# Patient Record
Sex: Female | Born: 1965 | Race: White | Hispanic: No | Marital: Married | Smoking: Former smoker
Health system: Southern US, Community
[De-identification: ages and names within clinical notes are randomized; demographics above are authoritative.]

## PROBLEM LIST (undated history)

## (undated) HISTORY — PX: CHOLECYSTECTOMY: SHX55

---

## 2001-03-05 ENCOUNTER — Observation Stay (HOSPITAL_COMMUNITY): Admission: RE | Admit: 2001-03-05 | Discharge: 2001-03-06 | Payer: Self-pay | Admitting: General Surgery

## 2007-07-17 ENCOUNTER — Encounter: Admission: RE | Admit: 2007-07-17 | Discharge: 2007-07-17 | Payer: Self-pay | Admitting: Obstetrics and Gynecology

## 2007-10-16 ENCOUNTER — Other Ambulatory Visit: Admission: RE | Admit: 2007-10-16 | Discharge: 2007-10-16 | Payer: Self-pay | Admitting: Obstetrics and Gynecology

## 2009-09-07 ENCOUNTER — Other Ambulatory Visit: Admission: RE | Admit: 2009-09-07 | Discharge: 2009-09-07 | Payer: Self-pay | Admitting: Obstetrics and Gynecology

## 2010-10-13 NOTE — Op Note (Signed)
[facility]  Patient:    Hayley Lynch, Hayley Lynch Visit Number: [id number] MRN: [id number]          Service Type: OBV Location: 3 A304 01 Attending Physician:  Barbaraann Barthel Dictated by:   Barbaraann Barthel, M.D. Proc. Date: 03/05/01 Admit Date:  03/05/2001   CC:         Zigmund Gottron, M.D., c/o Barbaraann Barthel, M.D.   Operative Report  SURGEON:  Barbaraann Barthel, M.D.  ASSISTANT:  Christin Bach, M.D.  PREOPERATIVE DIAGNOSIS:  Cholecystitis secondary to polyp, gallbladder polyp versus stone.  POSTOPERATIVE DIAGNOSIS:  PROCEDURE:  Laparoscopic cholecystectomy.  NOTE:  This is a 45 year old white female who had episodes of right upper quadrant postprandial pain, particularly after eating a greasy meal.  She had a sonogram which revealed the presence of a stone or a polyp in her gallbladder.  She was referred for surgery and we discussed the need for laparoscopic cholecystectomy based on these findings.  We discussed complications not limited to but including bleeding, infection, damage to bile duct, perforations of organs and transitory diarrhea.  Informed consent was obtained.  GROSS OPERATIVE FINDINGS:  The patient had a moderate amount of adhesions around the gallbladder, a long floppy gallbladder with a long cystic duct which was normal caliber and was not cannulated for cholangiogram.  The rest of the right upper quadrant appeared to be normal.  She had a rather large lymph node of [place] noted; otherwise, the right upper quadrant appeared to be normal grossly laparoscopically speaking.  TECHNIQUE:  The patient was placed in the supine position and after the adequate administration of general anesthesia via endotracheal intubation, her entire abdomen was prepped with Betadine solution and draped in the usual manner; prior to this, a Foley catheter was aseptically inserted.  A periumbilical incision was carried out over the superior aspect  of the umbilicus, the fascia was grasped with a sharp towel clip and a Veress needle was inserted and confirmed in position with a saline drop test.  Then using the Visiport technique, an 11-mm [place] Surgical cannula was placed in the umbilicus incision and then under direct vision, three other cannulae were placed, an 11-mm cannula in the epigastrium and two 5-mm cannulae in the right upper quadrant laterally.  The gallbladder was then grasped with the graspers, its adhesions taken down and the cystic duct was triply silver-clipped on the side of the common bile duct and singly silver-clipped on the side of the gallbladder and divided.  The cystic artery was likewise identified, triply silver-clipped and divided.  The gallbladder was then removed uneventfully from the hepatic bed using a hook cautery device.  The area was then irrigated and we checked for hemostasis -- this was controlled with the hook cautery -- and the gallbladder was then removed from the epigastric incision.  I then placed a small amount of Surgicel over the liver bed and then desufflated the abdomen after checking for hemostasis and suctioning out the irrigation.  The fascia was then closed in the area of the umbilicus and epigastric incision with 0 Polysorb suture and all skin incisions were then approximated with small surgical clips.  I elected to place 0.5% Sensorcaine around all incisions to help with postoperative discomfort.  Prior to closure, all sponge, needle and instrument counts were found to be correct.  Estimated blood loss was minimal. Patient received 800 cc of crystalloids intraoperatively and there were no complications. Dictated by:   Barbaraann Barthel, M.D. Attending Physician:  Malvin Johns,  Chrissie Noa DD:  03/05/01 TD:  03/06/01 Job: [id number]

## 2019-02-08 DIAGNOSIS — Z20828 Contact with and (suspected) exposure to other viral communicable diseases: Secondary | ICD-10-CM | POA: Diagnosis not present

## 2019-02-09 DIAGNOSIS — R350 Frequency of micturition: Secondary | ICD-10-CM | POA: Diagnosis not present

## 2019-03-16 ENCOUNTER — Telehealth: Payer: Self-pay | Admitting: Obstetrics and Gynecology

## 2019-03-16 NOTE — Telephone Encounter (Signed)

## 2019-03-17 ENCOUNTER — Other Ambulatory Visit (HOSPITAL_COMMUNITY)
Admission: RE | Admit: 2019-03-17 | Discharge: 2019-03-17 | Disposition: A | Discharge status: Home / Self Care | Payer: BC Managed Care – PPO | Source: Ambulatory Visit | Attending: Obstetrics and Gynecology | Admitting: Obstetrics and Gynecology

## 2019-03-17 ENCOUNTER — Encounter: Payer: Self-pay | Admitting: Obstetrics and Gynecology

## 2019-03-17 ENCOUNTER — Other Ambulatory Visit: Payer: Self-pay

## 2019-03-17 ENCOUNTER — Ambulatory Visit (INDEPENDENT_AMBULATORY_CARE_PROVIDER_SITE_OTHER): Payer: BC Managed Care – PPO | Admitting: Obstetrics and Gynecology

## 2019-03-17 VITALS — BP 162/98 | HR 76 | Ht 64.25 in | Wt 247.0 lb

## 2019-03-17 DIAGNOSIS — Z01419 Encounter for gynecological examination (general) (routine) without abnormal findings: Secondary | ICD-10-CM | POA: Diagnosis not present

## 2019-03-17 DIAGNOSIS — Z87440 Personal history of urinary (tract) infections: Secondary | ICD-10-CM | POA: Diagnosis not present

## 2019-03-17 DIAGNOSIS — E669 Obesity, unspecified: Secondary | ICD-10-CM

## 2019-03-17 LAB — POCT URINALYSIS DIPSTICK OB
Blood, UA: NEGATIVE
Glucose, UA: NEGATIVE
Ketones, UA: NEGATIVE
Leukocytes, UA: NEGATIVE
Nitrite, UA: NEGATIVE
POC,PROTEIN,UA: NEGATIVE

## 2019-03-17 NOTE — Patient Instructions (Signed)
.jfweight Discussion: Discussed with pt weight loss methods including food measurement and calorie counting using apps such as MyNetDiary or My Fitness Pal. Recommended the use of measuring cups and spoons to monitor serving sizes. Encouraged adequate daily water intake, especially prior to meals to eliminate overeating. Additionally encouraged pt to become more active by taking daily walks of at least 30 minute duration, join a local gym such as the [facility] or attend water aerobics classes. Also advised pt to use pedometer on smartphone or utilize a smartband such as FitBit to keep track of daily activity.     ITrackBites app for weight loss management  Patient Education   Weight Loss Diet  About this topic  There are many "trendy" weight loss diets that are popular today. Many of these diets can end up being more harmful than helpful. The healthiest way to lose weight is to burn more calories than you eat.  A weight loss diet should help you have a healthy view of eating. It is NOT healthy to stop eating to try and lose weight. A good diet plan will help you cut down your food intake and make healthy choices.  A healthy weight loss goal is 1 to 2 pounds (0.5 to 1 kg) per week. It takes 3500 calories to lose 1 pound (0.5 kg). That means cutting out 500 calories every day for 7 days. You can lose these 500 calories per day by eating fewer calories, burning them through exercise, or both.  It may be easier than you think to cut 500 calories from your daily intake. You can:   Switch from whole milk to 1% or skim milk   Switch from regular cheese to fat-free cheese   Use healthier condiment choices  ? Fat-free sour cream or salad dressings  ? Spray butter  ? Diet syrups or jellies over regular   Try frozen yogurt as a dessert rather than eating ice cream.   Skip the chips. Snack on carrots, vegetables, or fruit. If chips are a favorite of yours, try the baked style.   Eat boiled, seared fish  or skinless chicken rather than red meat.   Try flavored no-calorie waters. Do not drink soda and juices that have many calories.  General   Eating smaller meals more often may be helpful. This will help keep your metabolism high and your hunger low. This will keep you from overeating at your next meal. Also, eating meals slowly helps you feel full faster.  ? If eating 3 meals is a part of your lifestyle, choose more low-fat proteins and higher fiber to fill you up at each meal. Skip the snack at night if you can. Drink a calorie-free beverage instead.   Do not skip meals. Most often if you skip a meal, you eat too much at the next meal.   Eat smaller portions. Use a smaller plate or bowl for meals, and when you are eating out, eat half and take the rest home.   Plan ahead. Plan your meals and grocery list before going to the store. Planning will keep you from getting meals from fast foods or restaurants.   Do not go to the grocery store hungry. You are more likely to buy snacks that are not good for you.   Portion out snacks. When you are having a snack, instead of grabbing the whole bag, portion a small amount out to give yourself a stopping point.   Drink water before and after your meals  to help fill you up without the calories.   When eating starchy foods, choose whole-grain products. These have a lot of fiber which will make you feel full. Fiber also helps lower cholesterol and helps with bowel function.   If you need a helpful start, ask your doctor to send you to a dietitian for weight loss help.    What will the results be?  Losing excess weight will make your whole body healthier. You will have more energy for your daily activities and lower your risk for health problems.  What lifestyle changes are needed?  Stay active. Eating healthy is not always enough to lose weight. Burning calories by exercising is a big part of weight loss.  What foods are good to eat?  The key is to  watch your portion sizes. It is best to choose foods that are lower in fat and calories.   Choose low-fat meats:  ? Boneless chicken breast  ? Pork loin  ? 90% lean beef  ? Lean [place]key meat  ? Fresh fish (not fried)   Choose low-fat dairy products:  ? 1% or skim milk  ? Spray butter or margarine  ? Low-fat or fat-free cheese  ? Frozen yogurt or low calorie ice cream   Choose fresh fruits, green vegetables, beans and lentils, and whole wheat products more often.   Choose smart snacks:  ? Baked chips  ? Pretzels  ? Popcorn with no butter ? use pepper, garlic, or another spice to taste  ? High fiber, low-fat crackers  ? Reduced fat cookies  ? Diet or no-calorie beverages  What foods should be limited or avoided?  Limit high-fat, high-sodium, and high-calorie foods like:   Fried foods   Processed meats   Whole-fat dairy products   Candy, cookies, chips, pastries   Sausage, bacon, any full-fat meats   Soda, juice   Beer, wine, and mixed drinks (alcohol)  Will there be any other care needed?  What do I do first before trying to lose weight?   Talk to your doctor and dietitian to see if you need to lose weight. Work with them to set your weight loss goals.   If you have a chronic illness, such as high blood sugar or high blood pressure, ask a doctor or dietitian what diet and exercise is right for you.   Ask your doctor about how much you are able to exercise and what type of exercise is good for you.  Helpful tips   Keep a food journal to help keep you on track.   Do not eat 2 hours before bedtime.   Join a support group.  Tips for burning calories:   If your workplace is near your house, choose to walk or bike to work instead of driving.   Take 20-minute walks each day. Walk around during your lunch break. You will not only burn calories, but raise your energy for the rest of the day.   Take the stairs over the elevators.   Join a gym or exercise class with a  friend.   Try to exercise 30 minutes a day. Three 10 minute sessions works too.   Drink lots of water before, during, and after exercise.  Where can I learn more?  https://www.bernard.org/ChooseMyPlate.gov  https://powell.info/https://www.choosemyplate.gov/ten-tips-make-better-food-choices  https://www.bernard.org/ChooseMyPlate.gov  https://robinson-schaefer.info/https://www.choosemyplate.gov/ten-tips-get-the-facts-to-look-and-feel-better  Weight-Control Information Network  https://www.hensley.biz/http://win.niddk.nih.gov/publications/for_life.htm  Weight-Control Information Network  MrDecember.dkhttp://www.win.niddk.nih.gov/publications/better_health.htm  Last Reviewed Date  2015-02-22  Consumer Information Use and Disclaimer  This information is not specific medical  advice and does not replace information you receive from your health care provider. This is only a brief summary of general information. It does NOT include all information about conditions, illnesses, injuries, tests, procedures, treatments, therapies, discharge instructions or life-style choices that may apply to you. You must talk with your health care provider for complete information about your health and treatment options. This information should not be used to decide whether or not to accept your health care provider's advice, instructions or recommendations. Only your health care provider has the knowledge and training to provide advice that is right for you.  Copyright  Copyright  2019 [facility] [employer], Inc. and its affiliates and/or licensors. All rights reserved.

## 2019-03-17 NOTE — Progress Notes (Addendum)
Patient ID: Hayley Lynch, female   DOB: 09-11-1965, 53 y.o.   MRN: [id number]  Assessment:  Annual Gyn Exam CBC A1C TSH Lipids CMET All ordered Essential HTN vs anxiety Plan:  1. pap smear done, next pap due 3 years 2. return annually or prn 3    Schedule Mammo 4 pt to get bp cuff , report next 5 values. Subjective:  Hayley Lynch is a 53 y.o. female No obstetric history on file. who presents for annual exam. No LMP recorded. Patient is perimenopausal. The patient has complaints today of had UTI few weeks ago. Clear when checked in office today. BP today in office. Her and her son often go walking together. She quit smoking in [place] and isn't happy with her weight, she has had 20 lb weight gain but believes it is due to her smoking habits. Is no longer having periods and wants to know if she needs to take FE sulfate. She stays at home with her grandchildren. Mother died of ovarian cancer at 57. Last mammogram was in 2009.  The following portions of the patient's history were reviewed and updated as appropriate: allergies, current medications, past family history, past medical history, past social history, past surgical history and problem list. History reviewed. No pertinent past medical history.  Past Surgical History:  Procedure Laterality Date  . CHOLECYSTECTOMY      No current outpatient medications on file.  Review of Systems Constitutional: negative Gastrointestinal: negative Genitourinary: normal  Objective:  BP (!) 162/98 (BP Location: Right Arm, Patient Position: Sitting, Cuff Size: Normal)   Pulse 76   Ht 5' 4.25" (1.632 m)   Wt 247 lb (112 kg)   BMI 42.07 kg/m    BMI: Body mass index is 42.07 kg/m.  General Appearance: Alert, appropriate appearance for age. No acute distress HEENT: Grossly normal Neck / Thyroid:  Cardiovascular: RRR; normal S1, S2, no murmur Lungs: CTA bilaterally Back: No CVAT Breast Exam: Normal breast tissue bilaterally and No  masses or nodes.No dimpling, nipple retraction or discharge. Gastrointestinal: Soft, non-tender, no masses or organomegaly Pelvic Exam: VAGINA: normal appearing secretions CERVIX: normal appearing cervix,  UTERUS: uterus is normal size, shape, consistency and nontender,  RECTAL: guaiac negative stool obtained,  PAP: Pap smear done today. Lymphatic Exam: Non-palpable nodes in neck, clavicular, axillary, or inguinal regions  Skin: no rash or abnormalities Neurologic: Normal gait and speech, no tremor  Psychiatric: Alert and oriented, appropriate affect.  Urinalysis:Not done At end of discussion, pt had opportunity to ask questions and has no further questions at this time.   Specific discussion of lifestyle changes, behavioral modifications and healthy eating habits as noted above. Greater than 50% was spent in counseling and coordination of care with the patient.  Total time greater than: 25 minutes.  By signing my name below, I, Samul Dada, attest that this documentation has been prepared under the direction and in the presence of Jonnie Kind, MD. Electronically Signed: [place]. 03/17/19. 10:54 AM.  I personally performed the services described in this documentation, which was SCRIBED in my presence. The recorded information has been reviewed and considered accurate. It has been edited as necessary during review. Jonnie Kind, MD

## 2019-03-18 ENCOUNTER — Other Ambulatory Visit: Payer: Self-pay | Admitting: Obstetrics and Gynecology

## 2019-03-18 LAB — CBC
Hematocrit: 44.4 % (ref 34.0–46.6)
Hemoglobin: 14.4 g/dL (ref 11.1–15.9)
MCH: 31.5 pg (ref 26.6–33.0)
MCHC: 32.4 g/dL (ref 31.5–35.7)
MCV: 97 fL (ref 79–97)
Platelets: 243 10*3/uL (ref 150–450)
RBC: 4.57 x10E6/uL (ref 3.77–5.28)
RDW: 14.1 % (ref 11.7–15.4)
WBC: 6.5 10*3/uL (ref 3.4–10.8)

## 2019-03-18 LAB — COMPREHENSIVE METABOLIC PANEL
ALT: 108 IU/L — ABNORMAL HIGH (ref 0–32)
AST: 73 IU/L — ABNORMAL HIGH (ref 0–40)
Albumin/Globulin Ratio: 1.4 (ref 1.2–2.2)
Albumin: 4 g/dL (ref 3.8–4.9)
Alkaline Phosphatase: 141 IU/L — ABNORMAL HIGH (ref 39–117)
BUN/Creatinine Ratio: 17 (ref 9–23)
BUN: 15 mg/dL (ref 6–24)
Bilirubin Total: 0.3 mg/dL (ref 0.0–1.2)
CO2: 21 mmol/L (ref 20–29)
Calcium: 9.4 mg/dL (ref 8.7–10.2)
Chloride: 105 mmol/L (ref 96–106)
Creatinine, Ser: 0.9 mg/dL (ref 0.57–1.00)
GFR calc Af Amer: 83 mL/min/{1.73_m2} (ref >59)
GFR calc non Af Amer: 72 mL/min/{1.73_m2} (ref >59)
Globulin, Total: 2.9 g/dL (ref 1.5–4.5)
Glucose: 110 mg/dL — ABNORMAL HIGH (ref 65–99)
Potassium: 4.3 mmol/L (ref 3.5–5.2)
Sodium: 142 mmol/L (ref 134–144)
Total Protein: 6.9 g/dL (ref 6.0–8.5)

## 2019-03-18 LAB — LIPID PANEL WITH LDL/HDL RATIO
Cholesterol, Total: 203 mg/dL — ABNORMAL HIGH (ref 100–199)
HDL: 48 mg/dL (ref >39)
LDL Chol Calc (NIH): 134 mg/dL — ABNORMAL HIGH (ref 0–99)
LDL/HDL Ratio: 2.8 ratio (ref 0.0–3.2)
Triglycerides: 116 mg/dL (ref 0–149)
VLDL Cholesterol Cal: 21 mg/dL (ref 5–40)

## 2019-03-18 LAB — TSH: TSH: 4.96 u[IU]/mL — ABNORMAL HIGH (ref 0.450–4.500)

## 2019-03-18 LAB — HEMOGLOBIN A1C
Est. average glucose Bld gHb Est-mCnc: 117 mg/dL
Hgb A1c MFr Bld: 5.7 % — ABNORMAL HIGH (ref 4.8–5.6)

## 2019-03-18 MED ORDER — LEVOTHYROXINE SODIUM 50 MCG PO TABS: 50.0000 ug | ORAL_TABLET | Freq: Every day | ORAL | 6 refills | Status: DC

## 2019-03-18 NOTE — Progress Notes (Signed)
Synthroid Rx sent to pharmacy

## 2019-03-19 LAB — CYTOLOGY - PAP
Comment: NEGATIVE
Diagnosis: NEGATIVE
High risk HPV: NEGATIVE

## 2019-04-06 ENCOUNTER — Ambulatory Visit (HOSPITAL_COMMUNITY)
Admission: RE | Admit: 2019-04-06 | Discharge: 2019-04-06 | Disposition: A | Discharge status: Home / Self Care | Payer: BC Managed Care – PPO | Source: Ambulatory Visit | Attending: Obstetrics and Gynecology | Admitting: Obstetrics and Gynecology

## 2019-04-06 ENCOUNTER — Other Ambulatory Visit: Payer: Self-pay

## 2019-04-06 ENCOUNTER — Other Ambulatory Visit (HOSPITAL_COMMUNITY): Payer: Self-pay | Admitting: Obstetrics and Gynecology

## 2019-04-06 DIAGNOSIS — Z1231 Encounter for screening mammogram for malignant neoplasm of breast: Secondary | ICD-10-CM

## 2019-09-11 ENCOUNTER — Other Ambulatory Visit: Payer: Self-pay | Admitting: Obstetrics and Gynecology

## 2019-12-01 ENCOUNTER — Ambulatory Visit
Admission: EM | Admit: 2019-12-01 | Discharge: 2019-12-01 | Disposition: A | Discharge status: Home / Self Care | Payer: BC Managed Care – PPO | Attending: Emergency Medicine | Admitting: Emergency Medicine

## 2019-12-01 ENCOUNTER — Encounter: Payer: Self-pay | Admitting: Emergency Medicine

## 2019-12-01 DIAGNOSIS — M545 Low back pain, unspecified: Secondary | ICD-10-CM

## 2019-12-01 DIAGNOSIS — R350 Frequency of micturition: Secondary | ICD-10-CM

## 2019-12-01 LAB — POCT URINALYSIS DIP (MANUAL ENTRY)
Blood, UA: NEGATIVE
Glucose, UA: NEGATIVE mg/dL
Ketones, POC UA: NEGATIVE mg/dL
Leukocytes, UA: NEGATIVE
Nitrite, UA: NEGATIVE
Protein Ur, POC: 30 mg/dL — AB
Spec Grav, UA: >=1.03 — AB (ref 1.010–1.025)
Urobilinogen, UA: 1 E.U./dL
pH, UA: 5.5 (ref 5.0–8.0)

## 2019-12-01 MED ORDER — PREDNISONE 20 MG PO TABS: 20.0000 mg | ORAL_TABLET | Freq: Two times a day (BID) | ORAL | 0 refills | Status: AC

## 2019-12-01 NOTE — Discharge Instructions (Signed)
Urine without signs of infection Continue conservative management of rest, ice, and gentle stretches/ massage Prednisone prescribed.  Take as directed and to completion Follow up with PCP if symptoms persist Return or go to the ER if you have any new or worsening symptoms (fever, chills, chest pain, abdominal pain, changes in bowel or bladder habits, pain radiating into lower legs, etc...)

## 2019-12-01 NOTE — ED Triage Notes (Signed)
Pt has been having lower back pain for a few week, but noticed urgency and her urine getting darker.

## 2019-12-01 NOTE — ED Provider Notes (Signed)
[facility]   [id number] 12/01/19 Arrival Time: 1224  CC: Back pain  SUBJECTIVE: History from: patient. Hayley Lynch is a 54 y.o. female complains of low back pain x few days.  Reports recent trip to the beach and loading/ unloading the vehicle.  Localizes the pain to the LT low back.  Describes the pain as intermittent and "squeezing" in character.  Has tried OTC medications without relief.  Denies aggravating factors.  Reports similar symptoms in the past with kidney infection.  Complains of associated urinary frequency.  Denies fever, chills, erythema, ecchymosis, effusion, weakness, numbness and tingling, saddle paresthesias, loss of bowel or bladder function.      ROS: As per HPI.  All other pertinent ROS negative.     History reviewed. No pertinent past medical history. Past Surgical History:  Procedure Laterality Date   CHOLECYSTECTOMY     Allergies  Allergen Reactions   Codeine    Penicillins    No current facility-administered medications on file prior to encounter.   Current Outpatient Medications on File Prior to Encounter  Medication Sig Dispense Refill   levothyroxine (SYNTHROID) 50 MCG tablet TAKE 1 TABLET BY MOUTH DAILY BEFORE BREAKFAST 90 tablet 2   Social History   Socioeconomic History   Marital status: Married    Spouse name: Not on file   Number of children: Not on file   Years of education: Not on file   Highest education level: Not on file  Occupational History   Not on file  Tobacco Use   Smoking status: Former Smoker    Quit date: 06/28/2018    Years since quitting: 1.4   Smokeless tobacco: Never Used  Vaping Use   Vaping Use: Never used  Substance and Sexual Activity   Alcohol use: Not Currently   Drug use: Not Currently   Sexual activity: Not Currently    Birth control/protection: None  Other Topics Concern   Not on file  Social History Narrative   Not on file   Social Determinants of Health   Financial  Resource Strain:    Difficulty of Paying Living Expenses:   Food Insecurity:    Worried About Programme researcher, broadcasting/film/video in the Last Year:    Barista in the Last Year:   Transportation Needs:    Freight forwarder (Medical):    Lack of Transportation (Non-Medical):   Physical Activity:    Days of Exercise per Week:    Minutes of Exercise per Session:   Stress:    Feeling of Stress :   Social Connections:    Frequency of Communication with Friends and Family:    Frequency of Social Gatherings with Friends and Family:    Attends Religious Services:    Active Member of Clubs or Organizations:    Attends Engineer, structural:    Marital Status:   Intimate Partner Violence:    Fear of Current or Ex-Partner:    Emotionally Abused:    Physically Abused:    Sexually Abused:    Family History  Problem Relation Age of Onset   Cancer Mother        ovarian    OBJECTIVE:  Vitals:   12/01/19 1249 12/01/19 1250  BP: (!) 163/74   Pulse: 83   Resp: 16   Temp: 98.6 F (37 C)   TempSrc: Oral   SpO2: 97%   Weight:  240 lb (108.9 kg)    General appearance: ALERT; in no  acute distress.  Head: NCAT Lungs: Normal respiratory effort; CTAB CV: RRR Musculoskeletal: Back Inspection: Skin warm, dry, clear and intact without obvious erythema, effusion, or ecchymosis.  Palpation: TTP over LT low back ROM: FROM active and passive Strength: 5/5 shld abduction, 5/5 shld adduction, 5/5 elbow flexion, 5/5 elbow extension, 5/5 grip strength, 5/5 hip flexion, 5/5 hip extension Abdomen: soft, nondistended, normal active bowel sounds; nontender to palpation; no guarding  Skin: warm and dry Neurologic: Ambulates without difficulty; Sensation intact about the upper/ lower extremities Psychological: alert and cooperative; normal mood and affect  LABS:  Results for orders placed or performed during the hospital encounter of 12/01/19 (from the past 24 hour(s))  POCT  urinalysis dipstick     Status: Abnormal   Collection Time: 12/01/19 12:46 PM  Result Value Ref Range   Color, UA straw (A) yellow   Clarity, UA clear clear   Glucose, UA negative negative mg/dL   Bilirubin, UA small (A) negative   Ketones, POC UA negative negative mg/dL   Spec Grav, UA >=4.259 (A) 1.010 - 1.025   Blood, UA negative negative   pH, UA 5.5 5.0 - 8.0   Protein Ur, POC =30 (A) negative mg/dL   Urobilinogen, UA 1.0 0.2 or 1.0 E.U./dL   Nitrite, UA Negative Negative   Leukocytes, UA Negative Negative     ASSESSMENT & PLAN:  1. Acute left-sided low back pain without sciatica   2. Urinary frequency     Meds ordered this encounter  Medications   predniSONE (DELTASONE) 20 MG tablet    Sig: Take 1 tablet (20 mg total) by mouth 2 (two) times daily with a meal for 5 days.    Dispense:  10 tablet    Refill:  0    Order Specific Question:   Supervising Provider    Answer:   Eustace Moore [[id number]]   Urine without signs of infection Continue conservative management of rest, ice, and gentle stretches/ massage Prednisone prescribed.  Take as directed and to completion Follow up with PCP if symptoms persist Return or go to the ER if you have any new or worsening symptoms (fever, chills, chest pain, abdominal pain, changes in bowel or bladder habits, pain radiating into lower legs, etc...)   Reviewed expectations re: course of current medical issues. Questions answered. Outlined signs and symptoms indicating need for more acute intervention. Patient verbalized understanding. After Visit Summary given.    Rennis Harding, PA-C 12/01/19 1346

## 2019-12-14 ENCOUNTER — Encounter: Payer: Self-pay | Admitting: Emergency Medicine

## 2019-12-14 ENCOUNTER — Ambulatory Visit (INDEPENDENT_AMBULATORY_CARE_PROVIDER_SITE_OTHER): Payer: Self-pay

## 2019-12-14 ENCOUNTER — Other Ambulatory Visit: Payer: Self-pay

## 2019-12-14 ENCOUNTER — Ambulatory Visit: Payer: Self-pay

## 2019-12-14 ENCOUNTER — Ambulatory Visit
Admission: EM | Admit: 2019-12-14 | Discharge: 2019-12-14 | Disposition: A | Discharge status: Home / Self Care | Payer: Self-pay | Attending: Emergency Medicine | Admitting: Emergency Medicine

## 2019-12-14 DIAGNOSIS — M25572 Pain in left ankle and joints of left foot: Secondary | ICD-10-CM

## 2019-12-14 DIAGNOSIS — S99912A Unspecified injury of left ankle, initial encounter: Secondary | ICD-10-CM

## 2019-12-14 DIAGNOSIS — S82832A Other fracture of upper and lower end of left fibula, initial encounter for closed fracture: Secondary | ICD-10-CM

## 2019-12-14 MED ORDER — TRAMADOL HCL 50 MG PO TABS: 50.0000 mg | ORAL_TABLET | Freq: Four times a day (QID) | ORAL | 0 refills | Status: DC | PRN

## 2019-12-14 NOTE — ED Provider Notes (Addendum)
[facility]   [id number] 12/14/19 Arrival Time: 1856  CC: LT ankle PAIN  SUBJECTIVE: History from: patient. Hayley Lynch is a 54 y.o. female complains of left ankle pain x 30 minutes.  Stepped in hole and twisted ankle.  Pain diffuse about the ankle.  Describes the pain as intermittent and "shock" in character.  Denies alleviating factors.  Symptoms are made worse with weight-bearing.  Denies similar symptoms in the past.  Complains of weakness, swelling and bruising.  Denies fever, chills, erythema, numbness and tingling.  ROS: As per HPI.  All other pertinent ROS negative.     History reviewed. No pertinent past medical history. Past Surgical History:  Procedure Laterality Date  . CHOLECYSTECTOMY     Allergies  Allergen Reactions  . Codeine   . Penicillins    No current facility-administered medications on file prior to encounter.   Current Outpatient Medications on File Prior to Encounter  Medication Sig Dispense Refill  . levothyroxine (SYNTHROID) 50 MCG tablet TAKE 1 TABLET BY MOUTH DAILY BEFORE BREAKFAST 90 tablet 2   Social History   Socioeconomic History  . Marital status: Married    Spouse name: Not on file  . Number of children: Not on file  . Years of education: Not on file  . Highest education level: Not on file  Occupational History  . Not on file  Tobacco Use  . Smoking status: Former Smoker    Quit date: 06/28/2018    Years since quitting: 1.4  . Smokeless tobacco: Never Used  Vaping Use  . Vaping Use: Never used  Substance and Sexual Activity  . Alcohol use: Not Currently  . Drug use: Not Currently  . Sexual activity: Not Currently    Birth control/protection: None  Other Topics Concern  . Not on file  Social History Narrative  . Not on file   Social Determinants of Health   Financial Resource Strain:   . Difficulty of Paying Living Expenses:   Food Insecurity:   . Worried About Programme researcher, broadcasting/film/video in the Last Year:   . Garment/textile technologist in the Last Year:   Transportation Needs:   . Freight forwarder (Medical):   Marland Kitchen Lack of Transportation (Non-Medical):   Physical Activity:   . Days of Exercise per Week:   . Minutes of Exercise per Session:   Stress:   . Feeling of Stress :   Social Connections:   . Frequency of Communication with Friends and Family:   . Frequency of Social Gatherings with Friends and Family:   . Attends Religious Services:   . Active Member of Clubs or Organizations:   . Attends Banker Meetings:   Marland Kitchen Marital Status:   Intimate Partner Violence:   . Fear of Current or Ex-Partner:   . Emotionally Abused:   Marland Kitchen Physically Abused:   . Sexually Abused:    Family History  Problem Relation Age of Onset  . Cancer Mother        ovarian    OBJECTIVE:  Vitals:   12/14/19 1907  BP: 139/81  Pulse: 79  Resp: 16  Temp: 98.6 F (37 C)  TempSrc: Oral  SpO2: 98%    General appearance: ALERT; in no acute distress.  Head: NCAT Lungs: Normal respiratory effort CV: Dorsalis pedis pulse 2+; cap refill < 2 seconds Musculoskeletal: LT ankle Inspection: Swelling Palpation: diffusely TTP over lateral ankle ROM: LROM about the ankle Strength: deferred Skin: warm and  dry Neurologic: Ambulates with antalgic gait; Sensation intact about the lower extremities Psychological: alert and cooperative; normal mood and affect  DIAGNOSTIC STUDIES:  DG Ankle Complete Left  Result Date: 12/14/2019 CLINICAL DATA:  54 year old female with left ankle pain. EXAM: LEFT ANKLE COMPLETE - 3+ VIEW COMPARISON:  None. FINDINGS: Minimally displaced fracture of the distal fibula. No other acute fracture. There is no dislocation. The bones are osteopenic. There is soft tissue swelling over the lateral malleolus. IMPRESSION: Minimally displaced fracture of the distal fibula. Electronically Signed   By: Elgie Collard M.D.   On: 12/14/2019 20:08     X-rays positive for distal fibula fracture.    I have  reviewed the x-rays myself and the radiologist interpretation. I am in agreement with the radiologist interpretation.     ASSESSMENT & PLAN:  1. Closed fracture of distal end of left fibula, unspecified fracture morphology, initial encounter   2. Injury of left ankle, initial encounter   3. Pain of joint of left ankle and foot      Meds ordered this encounter  Medications  . traMADol (ULTRAM) 50 MG tablet    Sig: Take 1 tablet (50 mg total) by mouth every 6 (six) hours as needed.    Dispense:  15 tablet    Refill:  0    Order Specific Question:   Supervising Provider    Answer:   Eustace Moore [[id number]]    Continue conservative management of rest, ice, and elevation Splint applied Crutches given Continue to alternate ibuprofen and tylenol Tramadol prescribed.  Use as needed for severe break-through pain.  DO NOT DRIVE OR OPERATE HEAVY MACHINERY AS THIS MEDICATION MAY MAKE YOU DROWSY Follow up with orthopedist for further evaluation and management Return or go to the ER if you have any new or worsening symptoms (fever, chills, chest pain, redness, swelling, bruising,deformity, worsening symptoms, etc...)   Reviewed expectations re: course of current medical issues. Questions answered. Outlined signs and symptoms indicating need for more acute intervention. Patient verbalized understanding. After Visit Summary given.    Rennis Harding, PA-C 12/14/19 1956    Alvino Chapel [place], PA-C 12/15/19 [phone]

## 2019-12-14 NOTE — ED Triage Notes (Signed)
Feel in hole and is having left ankle pain today.

## 2019-12-14 NOTE — Discharge Instructions (Signed)
Continue conservative management of rest, ice, and elevation Splint applied Crutches given Continue to alternate ibuprofen and tylenol Tramadol prescribed.  Use as needed for severe break-through pain.  DO NOT DRIVE OR OPERATE HEAVY MACHINERY AS THIS MEDICATION MAY MAKE YOU DROWSY Follow up with orthopedist for further evaluation and management Return or go to the ER if you have any new or worsening symptoms (fever, chills, chest pain, redness, swelling, bruising,deformity, worsening symptoms, etc...)

## 2019-12-17 ENCOUNTER — Encounter: Payer: Self-pay | Admitting: Orthopaedic Surgery

## 2019-12-17 ENCOUNTER — Other Ambulatory Visit: Payer: Self-pay

## 2019-12-17 ENCOUNTER — Ambulatory Visit (INDEPENDENT_AMBULATORY_CARE_PROVIDER_SITE_OTHER): Payer: Self-pay | Admitting: Orthopaedic Surgery

## 2019-12-17 VITALS — BP 148/81 | HR 77 | Ht 64.25 in | Wt 240.0 lb

## 2019-12-17 DIAGNOSIS — S8265XA Nondisplaced fracture of lateral malleolus of left fibula, initial encounter for closed fracture: Secondary | ICD-10-CM

## 2019-12-17 DIAGNOSIS — W1842XS Slipping, tripping and stumbling without falling due to stepping into hole or opening, sequela: Secondary | ICD-10-CM

## 2019-12-17 DIAGNOSIS — F17210 Nicotine dependence, cigarettes, uncomplicated: Secondary | ICD-10-CM

## 2019-12-17 NOTE — Progress Notes (Signed)
Subjective:    Patient ID: Hayley Lynch, female    DOB: 14-Jun-1965, 54 y.o.   MRN: [id number]  HPI She stepped in a hole and twisted her left ankle on 12-14-2019.  She had pain. She went to the ER.  X-rays there showed: IMPRESSION: Minimally displaced fracture of the distal fibula.  I have reviewed the ER notes.  I have independently reviewed and interpreted x-rays of this patient done at another site by another physician or qualified health professional.  She was placed in posterior splint.  She has no other injury.  Her pain is controlled.   Review of Systems  Constitutional: Positive for activity change.  Musculoskeletal: Positive for arthralgias, gait problem and joint swelling.  All other systems reviewed and are negative.  For Review of Systems, all other systems reviewed and are negative.  The following is a summary of the past history medically, past history surgically, known current medicines, social history and family history.  This information is gathered electronically by the computer from prior information and documentation.  I review this each visit and have found including this information at this point in the chart is beneficial and informative.   History reviewed. No pertinent past medical history.  Past Surgical History:  Procedure Laterality Date  . CHOLECYSTECTOMY      Current Outpatient Medications on File Prior to Visit  Medication Sig Dispense Refill  . levothyroxine (SYNTHROID) 50 MCG tablet TAKE 1 TABLET BY MOUTH DAILY BEFORE BREAKFAST (Patient not taking: Reported on 12/17/2019) 90 tablet 2  . traMADol (ULTRAM) 50 MG tablet Take 1 tablet (50 mg total) by mouth every 6 (six) hours as needed. (Patient not taking: Reported on 12/17/2019) 15 tablet 0   No current facility-administered medications on file prior to visit.    Social History   Socioeconomic History  . Marital status: Married    Spouse name: Not on file  . Number of children: Not on file   . Years of education: Not on file  . Highest education level: Not on file  Occupational History  . Not on file  Tobacco Use  . Smoking status: Current Every Day Smoker    Types: Cigarettes, E-cigarettes    Last attempt to quit: 06/28/2018    Years since quitting: 1.4  . Smokeless tobacco: Never Used  Vaping Use  . Vaping Use: Never used  Substance and Sexual Activity  . Alcohol use: Not Currently  . Drug use: Not Currently  . Sexual activity: Not Currently    Birth control/protection: None  Other Topics Concern  . Not on file  Social History Narrative  . Not on file   Social Determinants of Health   Financial Resource Strain:   . Difficulty of Paying Living Expenses:   Food Insecurity:   . Worried About Programme researcher, broadcasting/film/video in the Last Year:   . Barista in the Last Year:   Transportation Needs:   . Freight forwarder (Medical):   Marland Kitchen Lack of Transportation (Non-Medical):   Physical Activity:   . Days of Exercise per Week:   . Minutes of Exercise per Session:   Stress:   . Feeling of Stress :   Social Connections:   . Frequency of Communication with Friends and Family:   . Frequency of Social Gatherings with Friends and Family:   . Attends Religious Services:   . Active Member of Clubs or Organizations:   . Attends Banker Meetings:   .  Marital Status:   Intimate Partner Violence:   . Fear of Current or Ex-Partner:   . Emotionally Abused:   Marland Kitchen Physically Abused:   . Sexually Abused:     Family History  Problem Relation Age of Onset  . Cancer Mother        ovarian    BP (!) 148/81   Pulse 77   Ht 5' 4.25" (1.632 m)   Wt 240 lb (108.9 kg)   BMI 40.88 kg/m   Body mass index is 40.88 kg/m.      Objective:   Physical Exam Vitals and nursing note reviewed.  Constitutional:      Appearance: She is well-developed.  HENT:     Head: Normocephalic and atraumatic.  Eyes:     Conjunctiva/sclera: Conjunctivae normal.     Pupils:  Pupils are equal, round, and reactive to light.  Cardiovascular:     Rate and Rhythm: Normal rate and regular rhythm.  Pulmonary:     Effort: Pulmonary effort is normal.  Abdominal:     Palpations: Abdomen is soft.  Musculoskeletal:     Cervical back: Normal range of motion and neck supple.       Feet:  Skin:    General: Skin is warm and dry.  Neurological:     Mental Status: She is alert and oriented to person, place, and time.     Cranial Nerves: No cranial nerve deficit.     Motor: No abnormal muscle tone.     Coordination: Coordination normal.     Deep Tendon Reflexes: Reflexes are normal and symmetric. Reflexes normal.  Psychiatric:        Behavior: Behavior normal.        Thought Content: Thought content normal.        Judgment: Judgment normal.           Assessment & Plan:   Encounter Diagnoses  Name Primary?  . Nondisplaced fracture of lateral malleolus of left fibula, initial encounter for closed fracture Yes  . Nicotine dependence, cigarettes, uncomplicated    I have changed her to a CAM walker and given instructions.  Instructions given for Contrast Baths.  Elevate, ice, stay off foot.  Return in two weeks.  X-rays on return.  Call if any problem.  Precautions discussed.   Electronically Signed Darreld Mclean, MD 7/22/202112:12 PM

## 2019-12-17 NOTE — Patient Instructions (Signed)
Steps to Quit Smoking Smoking tobacco is the leading cause of preventable death. It can affect almost every organ in the body. Smoking puts you and people around you at risk for many serious, long-lasting (chronic) diseases. Quitting smoking can be hard, but it is one of the best things that you can do for your health. It is never too late to quit. How do I get ready to quit? When you decide to quit smoking, make a plan to help you succeed. Before you quit:  Pick a date to quit. Set a date within the next 2 weeks to give you time to prepare.  Write down the reasons why you are quitting. Keep this list in places where you will see it often.  Tell your family, friends, and co-workers that you are quitting. Their support is important.  Talk with your doctor about the choices that may help you quit.  Find out if your health insurance will pay for these treatments.  Know the people, places, things, and activities that make you want to smoke (triggers). Avoid them. What first steps can I take to quit smoking?  Throw away all cigarettes at home, at work, and in your car.  Throw away the things that you use when you smoke, such as ashtrays and lighters.  Clean your car. Make sure to empty the ashtray.  Clean your home, including curtains and carpets. What can I do to help me quit smoking? Talk with your doctor about taking medicines and seeing a counselor at the same time. You are more likely to succeed when you do both.  If you are pregnant or breastfeeding, talk with your doctor about counseling or other ways to quit smoking. Do not take medicine to help you quit smoking unless your doctor tells you to do so. To quit smoking: Quit right away  Quit smoking totally, instead of slowly cutting back on how much you smoke over a period of time.  Go to counseling. You are more likely to quit if you go to counseling sessions regularly. Take medicine You may take medicines to help you quit. Some  medicines need a prescription, and some you can buy over-the-counter. Some medicines may contain a drug called nicotine to replace the nicotine in cigarettes. Medicines may:  Help you to stop having the desire to smoke (cravings).  Help to stop the problems that come when you stop smoking (withdrawal symptoms). Your doctor may ask you to use:  Nicotine patches, gum, or lozenges.  Nicotine inhalers or sprays.  Non-nicotine medicine that is taken by mouth. Find resources Find resources and other ways to help you quit smoking and remain smoke-free after you quit. These resources are most helpful when you use them often. They include:  Online chats with a counselor.  Phone quitlines.  Printed self-help materials.  Support groups or group counseling.  Text messaging programs.  Mobile phone apps. Use apps on your mobile phone or tablet that can help you stick to your quit plan. There are many free apps for mobile phones and tablets as well as websites. Examples include Quit Guide from the CDC and smokefree.gov  What things can I do to make it easier to quit?   Talk to your family and friends. Ask them to support and encourage you.  Call a phone quitline (1-800-QUIT-NOW), reach out to support groups, or work with a counselor.  Ask people who smoke to not smoke around you.  Avoid places that make you want to smoke,   such as: ? Bars. ? Parties. ? Smoke-break areas at work.  Spend time with people who do not smoke.  Lower the stress in your life. Stress can make you want to smoke. Try these things to help your stress: ? Getting regular exercise. ? Doing deep-breathing exercises. ? Doing yoga. ? Meditating. ? Doing a body scan. To do this, close your eyes, focus on one area of your body at a time from head to toe. Notice which parts of your body are tense. Try to relax the muscles in those areas. How will I feel when I quit smoking? Day 1 to 3 weeks Within the first 24 hours,  you may start to have some problems that come from quitting tobacco. These problems are very bad 2-3 days after you quit, but they do not often last for more than 2-3 weeks. You may get these symptoms:  Mood swings.  Feeling restless, nervous, angry, or annoyed.  Trouble concentrating.  Dizziness.  Strong desire for high-sugar foods and nicotine.  Weight gain.  Trouble pooping (constipation).  Feeling like you may vomit (nausea).  Coughing or a sore throat.  Changes in how the medicines that you take for other issues work in your body.  Depression.  Trouble sleeping (insomnia). Week 3 and afterward After the first 2-3 weeks of quitting, you may start to notice more positive results, such as:  Better sense of smell and taste.  Less coughing and sore throat.  Slower heart rate.  Lower blood pressure.  Clearer skin.  Better breathing.  Fewer sick days. Quitting smoking can be hard. Do not give up if you fail the first time. Some people need to try a few times before they succeed. Do your best to stick to your quit plan, and talk with your doctor if you have any questions or concerns. Summary  Smoking tobacco is the leading cause of preventable death. Quitting smoking can be hard, but it is one of the best things that you can do for your health.  When you decide to quit smoking, make a plan to help you succeed.  Quit smoking right away, not slowly over a period of time.  When you start quitting, seek help from your doctor, family, or friends. This information is not intended to replace advice given to you by your health care provider. Make sure you discuss any questions you have with your health care provider. Document Revised: 02/06/2019 Document Reviewed: 08/02/2018 Elsevier Patient Education  2020 Elsevier Inc.  

## 2019-12-31 ENCOUNTER — Encounter: Payer: Self-pay | Admitting: Orthopaedic Surgery

## 2019-12-31 ENCOUNTER — Ambulatory Visit (INDEPENDENT_AMBULATORY_CARE_PROVIDER_SITE_OTHER): Payer: Self-pay | Admitting: Orthopaedic Surgery

## 2019-12-31 ENCOUNTER — Ambulatory Visit: Payer: Self-pay

## 2019-12-31 ENCOUNTER — Other Ambulatory Visit: Payer: Self-pay

## 2019-12-31 DIAGNOSIS — S8265XA Nondisplaced fracture of lateral malleolus of left fibula, initial encounter for closed fracture: Secondary | ICD-10-CM

## 2019-12-31 DIAGNOSIS — S8265XD Nondisplaced fracture of lateral malleolus of left fibula, subsequent encounter for closed fracture with routine healing: Secondary | ICD-10-CM

## 2019-12-31 NOTE — Patient Instructions (Signed)

## 2019-12-31 NOTE — Progress Notes (Signed)
My ankle is better  She has been using the CAM walker.  Her pain is decreased.  She has no new trauma.  Swelling is down. NV intact.  X-rays were done of the left ankle, reported separately.  Encounter Diagnosis  Name Primary?  . Nondisplaced fracture of lateral malleolus of left fibula, initial encounter for closed fracture Yes   Continue the CAM walker.  Return in two weeks.  X-rays of the left ankle then.  Call if any problem.  Precautions discussed.   Electronically Signed Darreld Mclean, MD 8/5/202110:50 AM

## 2020-01-14 ENCOUNTER — Ambulatory Visit: Payer: 59

## 2020-01-14 ENCOUNTER — Encounter: Payer: Self-pay | Admitting: Orthopaedic Surgery

## 2020-01-14 ENCOUNTER — Ambulatory Visit (INDEPENDENT_AMBULATORY_CARE_PROVIDER_SITE_OTHER): Payer: 59 | Admitting: Orthopaedic Surgery

## 2020-01-14 ENCOUNTER — Other Ambulatory Visit: Payer: Self-pay

## 2020-01-14 VITALS — Ht 64.25 in | Wt 240.0 lb

## 2020-01-14 DIAGNOSIS — S8265XD Nondisplaced fracture of lateral malleolus of left fibula, subsequent encounter for closed fracture with routine healing: Secondary | ICD-10-CM | POA: Diagnosis not present

## 2020-01-14 DIAGNOSIS — S8265XA Nondisplaced fracture of lateral malleolus of left fibula, initial encounter for closed fracture: Secondary | ICD-10-CM

## 2020-01-14 HISTORY — DX: Nondisplaced fracture of lateral malleolus of left fibula, initial encounter for closed fracture: S82.65XA

## 2020-01-14 NOTE — Progress Notes (Signed)
My ankle does not hurt that much  She has been using the CAM walker.  She has no new trauma.  Her pain is less. NV intact.  X-rays were done of the left ankle, reported separately.  I have shown her the X-rays and that this is slight healing but not much callus yet.  Encounter Diagnosis  Name Primary?  . Closed nondisplaced fracture of lateral malleolus of left fibula with routine healing, subsequent encounter Yes   Return in two weeks.  X-rays left ankle on return.  Call if any problem.  Precautions discussed.   Electronically Signed Darreld Mclean, MD 8/19/202110:35 AM

## 2020-01-28 ENCOUNTER — Ambulatory Visit: Payer: 59

## 2020-01-28 ENCOUNTER — Encounter: Payer: Self-pay | Admitting: Orthopaedic Surgery

## 2020-01-28 ENCOUNTER — Ambulatory Visit (INDEPENDENT_AMBULATORY_CARE_PROVIDER_SITE_OTHER): Payer: 59 | Admitting: Orthopaedic Surgery

## 2020-01-28 ENCOUNTER — Other Ambulatory Visit: Payer: Self-pay

## 2020-01-28 VITALS — Ht 64.0 in | Wt 240.0 lb

## 2020-01-28 DIAGNOSIS — S8265XD Nondisplaced fracture of lateral malleolus of left fibula, subsequent encounter for closed fracture with routine healing: Secondary | ICD-10-CM | POA: Diagnosis not present

## 2020-01-28 NOTE — Progress Notes (Signed)
I feel better  She has been using the CAM walker.  She has no pain.  NV intact.  X-rays were done of the left ankle, reported separately.  Her fracture line is still very visible but there is some more lateral callus.  I have told her to come out of the CAM walker at home but use it when out of the house.  Encounter Diagnosis  Name Primary?  . Closed nondisplaced fracture of lateral malleolus of left fibula with routine healing, subsequent encounter Yes   Return in three weeks.  X-rays on return.  Call if any problem.  Precautions discussed.   Electronically Signed Darreld Mclean, MD 9/2/20218:20 AM

## 2020-02-18 ENCOUNTER — Other Ambulatory Visit: Payer: Self-pay

## 2020-02-18 ENCOUNTER — Ambulatory Visit: Payer: 59

## 2020-02-18 ENCOUNTER — Ambulatory Visit (INDEPENDENT_AMBULATORY_CARE_PROVIDER_SITE_OTHER): Payer: 59 | Admitting: Orthopaedic Surgery

## 2020-02-18 ENCOUNTER — Encounter: Payer: Self-pay | Admitting: Orthopaedic Surgery

## 2020-02-18 DIAGNOSIS — S8265XD Nondisplaced fracture of lateral malleolus of left fibula, subsequent encounter for closed fracture with routine healing: Secondary | ICD-10-CM | POA: Diagnosis not present

## 2020-02-18 NOTE — Progress Notes (Signed)
My ankle is better.  She has no pain to the left ankle.  X-rays were done of the left ankle, reported separately.  She has some callus now but the fracture line is still present.  Encounter Diagnosis  Name Primary?   Closed nondisplaced fracture of lateral malleolus of left fibula with routine healing, subsequent encounter Yes   Come out of the CAM walker.  Precautions given.  Return in one month.  X-rays then.  Call if any problem.  Electronically Signed Darreld Mclean, MD 9/23/202110:18 AM

## 2020-03-17 ENCOUNTER — Ambulatory Visit: Payer: 59 | Admitting: Orthopaedic Surgery

## 2020-03-22 ENCOUNTER — Ambulatory Visit: Payer: 59

## 2020-03-22 ENCOUNTER — Encounter: Payer: Self-pay | Admitting: Orthopaedic Surgery

## 2020-03-22 ENCOUNTER — Ambulatory Visit (INDEPENDENT_AMBULATORY_CARE_PROVIDER_SITE_OTHER): Payer: 59 | Admitting: Orthopaedic Surgery

## 2020-03-22 ENCOUNTER — Other Ambulatory Visit: Payer: Self-pay

## 2020-03-22 DIAGNOSIS — S8265XD Nondisplaced fracture of lateral malleolus of left fibula, subsequent encounter for closed fracture with routine healing: Secondary | ICD-10-CM | POA: Diagnosis not present

## 2020-03-22 NOTE — Progress Notes (Signed)
I am much better, about 95 %  She has little pain of the left ankle now.  It twinges now and then.  She has no new trauma.  She is walking well.  X-rays were done of the left ankle, reported separately.   Encounter Diagnosis  Name Primary?  . Closed nondisplaced fracture of lateral malleolus of left fibula with routine healing, subsequent encounter Yes   I will see her as needed.  Call if any problem.  Precautions discussed.   Electronically Signed Darreld Mclean, MD 10/26/20212:18 PM

## 2020-05-25 ENCOUNTER — Other Ambulatory Visit (HOSPITAL_COMMUNITY): Payer: Self-pay | Admitting: Internal Medicine

## 2020-05-25 ENCOUNTER — Other Ambulatory Visit: Payer: Self-pay | Admitting: Internal Medicine

## 2020-05-25 ENCOUNTER — Other Ambulatory Visit: Payer: Self-pay

## 2020-05-25 ENCOUNTER — Ambulatory Visit (HOSPITAL_COMMUNITY)
Admission: RE | Admit: 2020-05-25 | Discharge: 2020-05-25 | Disposition: A | Discharge status: Home / Self Care | Payer: 59 | Source: Ambulatory Visit | Attending: Internal Medicine | Admitting: Internal Medicine

## 2020-05-25 DIAGNOSIS — R748 Abnormal levels of other serum enzymes: Secondary | ICD-10-CM | POA: Diagnosis present

## 2020-06-16 ENCOUNTER — Encounter (INDEPENDENT_AMBULATORY_CARE_PROVIDER_SITE_OTHER): Payer: Self-pay | Admitting: *Deleted

## 2020-09-22 ENCOUNTER — Ambulatory Visit (INDEPENDENT_AMBULATORY_CARE_PROVIDER_SITE_OTHER): Payer: 59 | Admitting: Gastroenterology

## 2021-01-16 ENCOUNTER — Ambulatory Visit (INDEPENDENT_AMBULATORY_CARE_PROVIDER_SITE_OTHER): Payer: 59 | Admitting: Gastroenterology

## 2021-02-05 NOTE — Progress Notes (Signed)
Subjective:    Patient ID: Hayley Lynch, female    DOB: Jan 19, 1966, 55 y.o.   MRN: [id number]  HPI: Hayley Lynch is a 55 y.o. female presenting for new patient visit to establish care.  Introduced to Publishing rights manager role and practice setting.  All questions answered.  Discussed provider/patient relationship and expectations.  Chief Complaint  Patient presents with   New Patient (Initial Visit)    New patient  -discuss weight loss / diet was taking medication or weight loss    DIABETES Currently taking Metformin XR 500 mg daily.  Was on Rebelsys for diabetes and fatty liver, had a lot of nausea with this medication so she stopped it.   Hypoglycemic episodes:no Polydipsia/polyuria: yes, drinks a lot of water Has been having some issue with urinary incontinence Visual disturbance: no Chest pain: no Paresthesias: no Blood Pressure Monitoring: rarely Normal BP: 130/70 Retinal Examination: sees eye Dr. Marland Kitchen [facility] in [place] Foot Exam:  done today Diabetic Education: Completed Pneumovax: Not up to Date Influenza: Not up to Date Aspirin: no Water intake:  has been trying to drink more water Statin: unable to tolerate, has been given Zetia  ELEVATED BMI: 40 Duration: chronic Previous attempts at weight loss: yes Complications of obesity: diabetes, low vitamin d Peak weight:  Weight loss goal: wants to weigh less than 200 lbs Weight loss to date: ~10 lbs Requesting obesity pharmacotherapy: yes Current weight loss supplements/medications: no Previous weight loss supplements/meds:  yes; Reblysus Physical activity: Not currently physically active often  Urinary incontinence has been going on and worsening for the past month.  No burning with urination.  Does not think she is going more frequently  No changes in color or odor of urine. Has has 3 children vaginally, has not had hysterectomy.  Allergies  Allergen Reactions   Codeine Nausea And Vomiting   Penicillins      As a child, mother told her she had hives    Outpatient Encounter Medications as of 02/06/2021  Medication Sig   acetaminophen (TYLENOL) 500 MG tablet Take 500 mg by mouth every 6 (six) hours as needed.   cholecalciferol (VITAMIN D3) 25 MCG (1000 UNIT) tablet Take 1,000 Units by mouth daily.   Ezetimibe (ZETIA PO) Take by mouth. 10 mg  once a day   metFORMIN (GLUCOPHAGE-XR) 500 MG 24 hr tablet Take 500 mg by mouth every morning.   [DISCONTINUED] levothyroxine (SYNTHROID) 50 MCG tablet TAKE 1 TABLET BY MOUTH DAILY BEFORE BREAKFAST (Patient not taking: No sig reported)   No facility-administered encounter medications on file as of 02/06/2021.    Active Ambulatory Problems    Diagnosis Date Noted   Type II diabetes mellitus (HCC) 02/06/2021   Vitamin D deficiency 02/06/2021   Hyperlipidemia associated with type 2 diabetes mellitus (HCC) 02/06/2021   Hepatic steatosis 02/06/2021   BMI 40.0-44.9, adult (HCC) 02/08/2021   Urge incontinence of urine 02/08/2021   Resolved Ambulatory Problems    Diagnosis Date Noted   Nondisplaced fracture of lateral malleolus of left fibula, initial encounter for closed fracture 01/14/2020   No Additional Past Medical History    Past Medical History:  Diagnosis Date   Nondisplaced fracture of lateral malleolus of left fibula, initial encounter for closed fracture 01/14/2020    Past Surgical History:  Procedure Laterality Date   CHOLECYSTECTOMY      Social History   Tobacco Use   Smoking status: Former    Types: Cigarettes, E-cigarettes  Quit date: 06/28/2018    Years since quitting: 2.6   Smokeless tobacco: Never  Vaping Use   Vaping Use: Every day  Substance Use Topics   Alcohol use: Not Currently   Drug use: Not Currently    Family History  Problem Relation Age of Onset   Cancer Mother        ovarian    Review of Systems Per HPI unless specifically indicated above     Objective:    BP 140/80 (BP Location: Left Arm,  Patient Position: Sitting, Cuff Size: Normal)   Pulse 74   Ht 5\' 4"  (1.626 m)   Wt 233 lb 8 oz (105.9 kg)   SpO2 95%   BMI 40.08 kg/m   Wt Readings from Last 3 Encounters:  02/06/21 233 lb 8 oz (105.9 kg)  01/28/20 240 lb (108.9 kg)  01/14/20 240 lb (108.9 kg)    Physical Exam Vitals and nursing note reviewed.  Constitutional:      General: She is not in acute distress.    Appearance: Normal appearance. She is obese. She is not toxic-appearing.  HENT:     Head: Normocephalic and atraumatic.     Left Ear: External ear normal.  Eyes:     General: No scleral icterus.    Extraocular Movements: Extraocular movements intact.  Neck:     Vascular: No carotid bruit.  Cardiovascular:     Rate and Rhythm: Normal rate and regular rhythm.     Heart sounds: Normal heart sounds. No murmur heard. Pulmonary:     Effort: Pulmonary effort is normal. No respiratory distress.     Breath sounds: Normal breath sounds. No wheezing, rhonchi or rales.  Abdominal:     General: Abdomen is flat. Bowel sounds are normal.     Palpations: Abdomen is soft.     Tenderness: There is no abdominal tenderness.  Musculoskeletal:        General: No swelling or tenderness. Normal range of motion.     Cervical back: Normal range of motion.     Right lower leg: No edema.     Left lower leg: No edema.  Skin:    General: Skin is warm and dry.     Capillary Refill: Capillary refill takes less than 2 seconds.     Coloration: Skin is not jaundiced or pale.     Findings: No bruising or erythema.  Neurological:     Mental Status: She is alert and oriented to person, place, and time.     Motor: No weakness.     Gait: Gait normal.  Psychiatric:        Mood and Affect: Mood normal.        Behavior: Behavior normal.        Thought Content: Thought content normal.        Judgment: Judgment normal.      Assessment & Plan:   Problem List Items Addressed This Visit       Digestive   Hepatic steatosis     Chronic.  Discussed dietary and lifestyle changes including low fat diet, increasing physical activity.  Will check liver enzymes today.  Follow up pending results.      Relevant Orders   COMPLETE METABOLIC PANEL WITH GFR (Completed)     Endocrine   Type II diabetes mellitus (HCC) - Primary    Chronic.  Last A1c unknown, will request records from previous PCP.  Check A1c today, discussed starting GLP-1a therapy if elevated above  goal.  Also check lipids today, patient is fasting.  Intolerant to statins but is taking Zetia.  Follow up pending results.       Relevant Medications   metFORMIN (GLUCOPHAGE-XR) 500 MG 24 hr tablet   Other Relevant Orders   Hemoglobin A1c (Completed)   CBC with Differential/Platelet (Completed)   Hyperlipidemia associated with type 2 diabetes mellitus (HCC)    Chronic.  Previously intolerant to statins, is taking Zetia and tolerating.  Check lipids today - she is fasting.  Records requesting from previous PCP. Follow up pending results.       Relevant Medications   metFORMIN (GLUCOPHAGE-XR) 500 MG 24 hr tablet   Ezetimibe (ZETIA PO)   Other Relevant Orders   COMPLETE METABOLIC PANEL WITH GFR (Completed)   CBC with Differential/Platelet (Completed)   Lipid panel (Completed)     Other   Vitamin D deficiency    Check Vitamin D level today and replace as indicated.      Relevant Orders   VITAMIN D 25 Hydroxy (Vit-D Deficiency, Fractures) (Completed)   Urge incontinence of urine    Check UA today and send culture to check for acute infection.  Consider urinary PT if continues to be a problem.      Relevant Orders   Urinalysis, Routine w reflex microscopic (Completed)   BMI 40.0-44.9, adult (HCC)    Discussed dietary and lifestyle changes including low carbohydrate/saturated fat diet.  Increase water intake to 64 oz daily.  Increase physical activity - goal is 30 minutes 5 times weekly.  Will plan to follow up in 4 weeks to see how dietary and lifestyle  changes are going and to discuss possibility of starting GLP-1a.      Relevant Medications   metFORMIN (GLUCOPHAGE-XR) 500 MG 24 hr tablet     Follow up plan: Return in about 4 weeks (around 03/06/2021) for follow up.

## 2021-02-06 ENCOUNTER — Ambulatory Visit (INDEPENDENT_AMBULATORY_CARE_PROVIDER_SITE_OTHER): Payer: 59 | Admitting: Nurse Practitioner

## 2021-02-06 ENCOUNTER — Other Ambulatory Visit: Payer: Self-pay

## 2021-02-06 ENCOUNTER — Encounter: Payer: Self-pay | Admitting: Nurse Practitioner

## 2021-02-06 VITALS — BP 140/80 | HR 74 | Ht 64.0 in | Wt 233.5 lb

## 2021-02-06 DIAGNOSIS — E785 Hyperlipidemia, unspecified: Secondary | ICD-10-CM

## 2021-02-06 DIAGNOSIS — E1165 Type 2 diabetes mellitus with hyperglycemia: Secondary | ICD-10-CM

## 2021-02-06 DIAGNOSIS — E119 Type 2 diabetes mellitus without complications: Secondary | ICD-10-CM | POA: Insufficient documentation

## 2021-02-06 DIAGNOSIS — Z6841 Body Mass Index (BMI) 40.0 and over, adult: Secondary | ICD-10-CM

## 2021-02-06 DIAGNOSIS — E1169 Type 2 diabetes mellitus with other specified complication: Secondary | ICD-10-CM | POA: Diagnosis not present

## 2021-02-06 DIAGNOSIS — E559 Vitamin D deficiency, unspecified: Secondary | ICD-10-CM

## 2021-02-06 DIAGNOSIS — N3941 Urge incontinence: Secondary | ICD-10-CM

## 2021-02-06 DIAGNOSIS — K760 Fatty (change of) liver, not elsewhere classified: Secondary | ICD-10-CM | POA: Diagnosis not present

## 2021-02-06 LAB — URINALYSIS, ROUTINE W REFLEX MICROSCOPIC
Bacteria, UA: NONE SEEN /HPF
Bilirubin Urine: NEGATIVE
Glucose, UA: NEGATIVE
Hgb urine dipstick: NEGATIVE
Hyaline Cast: NONE SEEN /LPF
Ketones, ur: NEGATIVE
Nitrite: NEGATIVE
Protein, ur: NEGATIVE
RBC / HPF: NONE SEEN /HPF (ref 0–2)
Specific Gravity, Urine: 1.019 (ref 1.001–1.035)
WBC, UA: NONE SEEN /HPF (ref 0–5)
pH: 6.5 (ref 5.0–8.0)

## 2021-02-07 LAB — COMPLETE METABOLIC PANEL WITH GFR
AG Ratio: 1.5 (calc) (ref 1.0–2.5)
ALT: 63 U/L — ABNORMAL HIGH (ref 6–29)
AST: 42 U/L — ABNORMAL HIGH (ref 10–35)
Albumin: 4.3 g/dL (ref 3.6–5.1)
Alkaline phosphatase (APISO): 122 U/L (ref 37–153)
BUN: 10 mg/dL (ref 7–25)
CO2: 27 mmol/L (ref 20–32)
Calcium: 9.5 mg/dL (ref 8.6–10.4)
Chloride: 104 mmol/L (ref 98–110)
Creat: 0.75 mg/dL (ref 0.50–1.03)
Globulin: 2.8 g/dL (calc) (ref 1.9–3.7)
Glucose, Bld: 123 mg/dL — ABNORMAL HIGH (ref 65–99)
Potassium: 4.3 mmol/L (ref 3.5–5.3)
Sodium: 141 mmol/L (ref 135–146)
Total Bilirubin: 0.4 mg/dL (ref 0.2–1.2)
Total Protein: 7.1 g/dL (ref 6.1–8.1)
eGFR: 93 mL/min/{1.73_m2} (ref >=60)

## 2021-02-07 LAB — CBC WITH DIFFERENTIAL/PLATELET
Absolute Monocytes: 665 cells/uL (ref 200–950)
Basophils Absolute: 67 cells/uL (ref 0–200)
Basophils Relative: 0.7 %
Eosinophils Absolute: 228 cells/uL (ref 15–500)
Eosinophils Relative: 2.4 %
HCT: 47.8 % — ABNORMAL HIGH (ref 35.0–45.0)
Hemoglobin: 15.9 g/dL — ABNORMAL HIGH (ref 11.7–15.5)
Lymphs Abs: 2774 cells/uL (ref 850–3900)
MCH: 31.3 pg (ref 27.0–33.0)
MCHC: 33.3 g/dL (ref 32.0–36.0)
MCV: 94.1 fL (ref 80.0–100.0)
MPV: 9.9 fL (ref 7.5–12.5)
Monocytes Relative: 7 %
Neutro Abs: 5767 cells/uL (ref 1500–7800)
Neutrophils Relative %: 60.7 %
Platelets: 268 10*3/uL (ref 140–400)
RBC: 5.08 10*6/uL (ref 3.80–5.10)
RDW: 12.5 % (ref 11.0–15.0)
Total Lymphocyte: 29.2 %
WBC: 9.5 10*3/uL (ref 3.8–10.8)

## 2021-02-07 LAB — HEMOGLOBIN A1C
Hgb A1c MFr Bld: 6.4 % of total Hgb — ABNORMAL HIGH (ref <5.7)
Mean Plasma Glucose: 137 mg/dL
eAG (mmol/L): 7.6 mmol/L

## 2021-02-07 LAB — LIPID PANEL
Cholesterol: 195 mg/dL (ref <200)
HDL: 42 mg/dL — ABNORMAL LOW (ref >=50)
LDL Cholesterol (Calc): 119 mg/dL (calc) — ABNORMAL HIGH
Non-HDL Cholesterol (Calc): 153 mg/dL (calc) — ABNORMAL HIGH (ref <130)
Total CHOL/HDL Ratio: 4.6 (calc) (ref <5.0)
Triglycerides: 224 mg/dL — ABNORMAL HIGH (ref <150)

## 2021-02-07 LAB — VITAMIN D 25 HYDROXY (VIT D DEFICIENCY, FRACTURES): Vit D, 25-Hydroxy: 41 ng/mL (ref 30–100)

## 2021-02-08 DIAGNOSIS — N3941 Urge incontinence: Secondary | ICD-10-CM | POA: Insufficient documentation

## 2021-02-08 DIAGNOSIS — Z6839 Body mass index (BMI) 39.0-39.9, adult: Secondary | ICD-10-CM | POA: Insufficient documentation

## 2021-02-08 DIAGNOSIS — Z6841 Body Mass Index (BMI) 40.0 and over, adult: Secondary | ICD-10-CM | POA: Insufficient documentation

## 2021-02-08 NOTE — Assessment & Plan Note (Signed)
Check UA today and send culture to check for acute infection.  Consider urinary PT if continues to be a problem.

## 2021-02-08 NOTE — Assessment & Plan Note (Signed)
Chronic.  Discussed dietary and lifestyle changes including low fat diet, increasing physical activity.  Will check liver enzymes today.  Follow up pending results.

## 2021-02-08 NOTE — Assessment & Plan Note (Signed)
Chronic.  Last A1c unknown, will request records from previous PCP.  Check A1c today, discussed starting GLP-1a therapy if elevated above goal.  Also check lipids today, patient is fasting.  Intolerant to statins but is taking Zetia.  Follow up pending results.

## 2021-02-08 NOTE — Assessment & Plan Note (Signed)
Chronic.  Previously intolerant to statins, is taking Zetia and tolerating.  Check lipids today - she is fasting.  Records requesting from previous PCP. Follow up pending results.

## 2021-02-08 NOTE — Assessment & Plan Note (Addendum)
Discussed dietary and lifestyle changes including low carbohydrate/saturated fat diet.  Increase water intake to 64 oz daily.  Increase physical activity - goal is 30 minutes 5 times weekly.  Will plan to follow up in 4 weeks to see how dietary and lifestyle changes are going and to discuss possibility of starting GLP-1a.

## 2021-02-08 NOTE — Assessment & Plan Note (Signed)
Check Vitamin D level today and replace as indicated.

## 2021-02-16 ENCOUNTER — Telehealth: Payer: Self-pay

## 2021-02-16 NOTE — Telephone Encounter (Signed)
Pt called in with a question about a bill that she received from [facility], I did get this info from pt.  DOS: 02/06/2021 AMT OF BILL: EST $600.00 INVOICE:  INSURANCE: Friday HEALTH  PLAN [id number] BEST CONTACT NO: [phone]

## 2021-02-21 ENCOUNTER — Encounter: Payer: Self-pay | Admitting: Nurse Practitioner

## 2021-02-22 NOTE — Telephone Encounter (Signed)
I have sent email to Chip Boer at [facility] waiting to hear back

## 2021-03-05 NOTE — Progress Notes (Signed)
Subjective:    Patient ID: Hayley Lynch, female    DOB: 05-16-1966, 55 y.o.   MRN: [id number]  HPI: Hayley Lynch is a 55 y.o. female presenting for follow up.   Chief Complaint  Patient presents with   1 month follow up     Diabetes   She is still having urinary urgency.  This is something that has been present for months now.  Last visit, we checked a urine sample that showed a small amount of leukocyte esterase.  Unfortunately, we did not send her urine at the last visit for a culture and sensitivity.    DIABETES Currently taking Metformin XR 500 mg daily.  Last A1c 6.4% last month.  She thinks prior to this, A1c was more than 7%.   Hypoglycemic episodes:no Polydipsia/polyuria: no Visual disturbance: no Chest pain: no Paresthesias: no Glucose Monitoring: no Taking Insulin?: no Blood Pressure Monitoring: daily Normal BP: 120s-130s/70s Retinal Examination: Not up to Date Foot Exam: Not up to Date Diabetic Education: Completed Pneumovax: Not up to Date Influenza: Up to Date Aspirin: no  ELEVATED BMI: 39 Duration: chronic Previous attempts at weight loss: yes Complications of obesity: diabetes, dyslipidemia Peak weight:  247 lbs Weight loss to date: ~15 lbs Requesting obesity pharmacotherapy: yes Current weight loss supplements/medications: no Previous weight loss supplements/meds: yes; Orlistat Physical activity: has been going for walks and playing with grand daughter outside.  Does not think she does as much as she needs to.   Allergies  Allergen Reactions   Codeine Nausea And Vomiting   Penicillins     As a child, mother told her she had hives    Outpatient Encounter Medications as of 03/06/2021  Medication Sig   Ezetimibe (ZETIA PO) Take by mouth. 10 mg  once a day   Liraglutide -Weight Management 18 MG/3ML SOPN Inject 0.6 mg into the skin daily for 14 days, THEN 1.2 mg daily for 14 days, THEN 1.8 mg daily for 14 days.   metFORMIN (GLUCOPHAGE-XR) 500  MG 24 hr tablet Take 500 mg by mouth every morning.   acetaminophen (TYLENOL) 500 MG tablet Take 500 mg by mouth every 6 (six) hours as needed.   cholecalciferol (VITAMIN D3) 25 MCG (1000 UNIT) tablet Take 1,000 Units by mouth daily.   No facility-administered encounter medications on file as of 03/06/2021.    Patient Active Problem List   Diagnosis Date Noted   BMI 40.0-44.9, adult (HCC) 02/08/2021   Urge incontinence of urine 02/08/2021   Type II diabetes mellitus (HCC) 02/06/2021   Vitamin D deficiency 02/06/2021   Hyperlipidemia associated with type 2 diabetes mellitus (HCC) 02/06/2021   Hepatic steatosis 02/06/2021    Past Medical History:  Diagnosis Date   Nondisplaced fracture of lateral malleolus of left fibula, initial encounter for closed fracture 01/14/2020    Relevant past medical, surgical, family and social history reviewed and updated as indicated. Interim medical history since our last visit reviewed.  Review of Systems Per HPI unless specifically indicated above     Objective:    BP 122/68   Pulse 98   Temp 97.9 F (36.6 C)   Ht 5\' 4"  (1.626 m)   Wt 232 lb (105.2 kg)   SpO2 97%   BMI 39.82 kg/m   Wt Readings from Last 3 Encounters:  03/06/21 232 lb (105.2 kg)  02/06/21 233 lb 8 oz (105.9 kg)  01/28/20 240 lb (108.9 kg)    Physical Exam Vitals and nursing  note reviewed.  Constitutional:      General: She is not in acute distress.    Appearance: Normal appearance. She is obese. She is not toxic-appearing.  Cardiovascular:     Rate and Rhythm: Normal rate.  Pulmonary:     Effort: Pulmonary effort is normal. No respiratory distress.  Skin:    General: Skin is warm and dry.     Coloration: Skin is not jaundiced or pale.     Findings: No erythema.  Neurological:     General: No focal deficit present.     Mental Status: She is alert and oriented to person, place, and time.     Motor: No weakness.     Gait: Gait normal.  Psychiatric:        Mood  and Affect: Mood normal.        Behavior: Behavior normal.        Thought Content: Thought content normal.        Judgment: Judgment normal.      Assessment & Plan:   Problem List Items Addressed This Visit       Endocrine   Type II diabetes mellitus (HCC)    Last A1c 6.4% which is borderline diabetes range.  I think should would do very well with addition of Saxenda to also help augment weight loss.  Start Saxenda and follow up with [place] in 1 month.       Relevant Medications   Liraglutide -Weight Management 18 MG/3ML SOPN     Other   Urge incontinence of urine    Check UA and send for culture to check for acute infection.  Discussed pelvic floor physical therapy if no acute infection.       Relevant Orders   Urinalysis, Routine w reflex microscopic   Urine Culture   BMI 40.0-44.9, adult (HCC) - Primary    Discussed dietary and lifestyle changes. Goal of physical activity is 30 minutes 5 times weekly.  Discussed pharmacotherapy and patient is agreeable to start GLP-1 therapy.  Start [place] daily.  Follow up in 1 month.       Relevant Medications   Liraglutide -Weight Management 18 MG/3ML SOPN   Other Visit Diagnoses     Immunization due       Relevant Orders   Flu Vaccine QUAD 47mo+IM (Fluarix, Fluzone & Alfiuria Quad PF) (Completed)        Follow up plan: Return in about 4 weeks (around 04/03/2021) for after starting weight loss medication to disc.

## 2021-03-06 ENCOUNTER — Other Ambulatory Visit: Payer: Self-pay

## 2021-03-06 ENCOUNTER — Ambulatory Visit (INDEPENDENT_AMBULATORY_CARE_PROVIDER_SITE_OTHER): Payer: 59 | Admitting: Nurse Practitioner

## 2021-03-06 VITALS — BP 122/68 | HR 98 | Temp 97.9°F | Ht 64.0 in | Wt 232.0 lb

## 2021-03-06 DIAGNOSIS — N3941 Urge incontinence: Secondary | ICD-10-CM

## 2021-03-06 DIAGNOSIS — Z23 Encounter for immunization: Secondary | ICD-10-CM

## 2021-03-06 DIAGNOSIS — E1165 Type 2 diabetes mellitus with hyperglycemia: Secondary | ICD-10-CM

## 2021-03-06 DIAGNOSIS — Z6841 Body Mass Index (BMI) 40.0 and over, adult: Secondary | ICD-10-CM

## 2021-03-06 MED ORDER — LIRAGLUTIDE -WEIGHT MANAGEMENT 18 MG/3ML ~~LOC~~ SOPN: PEN_INJECTOR | SUBCUTANEOUS | 0 refills | Status: DC

## 2021-03-06 NOTE — Telephone Encounter (Signed)
Per [address] [place], [place] Bill# [id number] DOS 02/06/2021                                          # [id number] DOS 02/06/2021     We received a requisition with no insurance in it, so patient was self-pay on both bills above. I updated the insurance information on our system and submitted charges to them today.     Thank you,

## 2021-03-10 ENCOUNTER — Encounter: Payer: Self-pay | Admitting: Nurse Practitioner

## 2021-03-10 NOTE — Assessment & Plan Note (Signed)
Check UA and send for culture to check for acute infection.  Discussed pelvic floor physical therapy if no acute infection.

## 2021-03-10 NOTE — Assessment & Plan Note (Signed)
Last A1c 6.4% which is borderline diabetes range.  I think should would do very well with addition of Saxenda to also help augment weight loss.  Start Saxenda and follow up with [place] in 1 month.

## 2021-03-10 NOTE — Assessment & Plan Note (Signed)
Discussed dietary and lifestyle changes. Goal of physical activity is 30 minutes 5 times weekly.  Discussed pharmacotherapy and patient is agreeable to start GLP-1 therapy.  Start [place] daily.  Follow up in 1 month.

## 2021-03-13 ENCOUNTER — Encounter: Payer: Self-pay | Admitting: Nurse Practitioner

## 2021-03-13 DIAGNOSIS — Z6841 Body Mass Index (BMI) 40.0 and over, adult: Secondary | ICD-10-CM

## 2021-03-13 DIAGNOSIS — E1165 Type 2 diabetes mellitus with hyperglycemia: Secondary | ICD-10-CM

## 2021-03-13 MED ORDER — SEMAGLUTIDE(0.25 OR 0.5MG/DOS) 2 MG/1.5ML ~~LOC~~ SOPN: 0.2500 mg | PEN_INJECTOR | SUBCUTANEOUS | 0 refills | Status: DC

## 2021-04-01 ENCOUNTER — Ambulatory Visit: Payer: Self-pay

## 2021-04-07 ENCOUNTER — Other Ambulatory Visit: Payer: Self-pay | Admitting: Nurse Practitioner

## 2021-04-07 DIAGNOSIS — Z6841 Body Mass Index (BMI) 40.0 and over, adult: Secondary | ICD-10-CM

## 2021-04-07 DIAGNOSIS — E1165 Type 2 diabetes mellitus with hyperglycemia: Secondary | ICD-10-CM

## 2021-04-19 ENCOUNTER — Ambulatory Visit (INDEPENDENT_AMBULATORY_CARE_PROVIDER_SITE_OTHER): Payer: 59 | Admitting: Nurse Practitioner

## 2021-04-19 ENCOUNTER — Other Ambulatory Visit: Payer: Self-pay

## 2021-04-19 ENCOUNTER — Encounter: Payer: Self-pay | Admitting: Nurse Practitioner

## 2021-04-19 VITALS — BP 132/80 | HR 90 | Temp 97.3°F | Ht 64.0 in | Wt 230.2 lb

## 2021-04-19 DIAGNOSIS — E1165 Type 2 diabetes mellitus with hyperglycemia: Secondary | ICD-10-CM | POA: Diagnosis not present

## 2021-04-19 DIAGNOSIS — Z6839 Body mass index (BMI) 39.0-39.9, adult: Secondary | ICD-10-CM | POA: Diagnosis not present

## 2021-04-19 DIAGNOSIS — Z1231 Encounter for screening mammogram for malignant neoplasm of breast: Secondary | ICD-10-CM

## 2021-04-19 DIAGNOSIS — Z6841 Body Mass Index (BMI) 40.0 and over, adult: Secondary | ICD-10-CM

## 2021-04-19 NOTE — Progress Notes (Signed)
Subjective:    Patient ID: Hayley Lynch, female    DOB: Sep 03, 1965, 55 y.o.   MRN: [id number]  HPI: Hayley Lynch is a 55 y.o. female presenting for follow up.  Chief Complaint  Patient presents with   Follow-up    Follow up    ELEVATED BMI: 39 Patient reports the semaglutide is not making her sick.  She is now taking semaglutide 0.5 mg weekly.  She has had some constipation - has a bowel movement every other day.  This is not painful or bothersome to her.  She is very happy with her weight loss. Duration:  chronic Previous attempts at weight loss: yes Complications of obesity: diabetes Peak weight: 247 lbs in 2020 Weight loss goal: TBD Weight loss to date: 3 lbs since starting semaglutide Requesting obesity pharmacotherapy: yes Current weight loss supplements/medications: semaglutide Previous weight loss supplements/meds: yes Water intake: 64 oz daily 24 hour diet recall: she is working on giving up soda. Physical activity: she tries to be more active - has been walking more  DIABETES Currently taking metformin XR 500 mg daily in the morning.  She is also taking and tolerating semaglutide 0.5 mg weekly well. Hypoglycemic episodes:no Polydipsia/polyuria: no Visual disturbance: no Chest pain: no Paresthesias: no Retinal Examination: Not up to Date - she will reach out to schedule Foot Exam: Up to Date Diabetic Education: Completed Pneumovax:  not up to date, patient declines Influenza:up to date Aspirin: no  Allergies  Allergen Reactions   Codeine Nausea And Vomiting   Penicillins     As a child, mother told her she had hives    Outpatient Encounter Medications as of 04/19/2021  Medication Sig   acetaminophen (TYLENOL) 500 MG tablet Take 500 mg by mouth every 6 (six) hours as needed.   cholecalciferol (VITAMIN D3) 25 MCG (1000 UNIT) tablet Take 1,000 Units by mouth daily.   Ezetimibe (ZETIA PO) Take by mouth. 10 mg  once a day   metFORMIN (GLUCOPHAGE-XR) 500  MG 24 hr tablet Take 500 mg by mouth every morning.   Semaglutide, 1 MG/DOSE, 4 MG/3ML SOPN Inject 1 mg as directed once a week for 28 days, THEN 2 mg once a week.   [DISCONTINUED] Semaglutide,0.25 or 0.5MG /DOS, (OZEMPIC, 0.25 OR 0.5 MG/DOSE,) 2 MG/1.5ML SOPN Inject 0.5 mg into the skin once a week.   No facility-administered encounter medications on file as of 04/19/2021.    Patient Active Problem List   Diagnosis Date Noted   BMI 39.0-39.9,adult 02/08/2021   Urge incontinence of urine 02/08/2021   Type II diabetes mellitus (HCC) 02/06/2021   Vitamin D deficiency 02/06/2021   Hyperlipidemia associated with type 2 diabetes mellitus (HCC) 02/06/2021   Hepatic steatosis 02/06/2021    Past Medical History:  Diagnosis Date   Nondisplaced fracture of lateral malleolus of left fibula, initial encounter for closed fracture 01/14/2020    Relevant past medical, surgical, family and social history reviewed and updated as indicated. Interim medical history since our last visit reviewed.  Review of Systems Per HPI unless specifically indicated above     Objective:    BP 132/80   Pulse 90   Temp (!) 97.3 F (36.3 C)   Ht 5\' 4"  (1.626 m)   Wt 230 lb 3.2 oz (104.4 kg)   SpO2 99%   BMI 39.51 kg/m   Wt Readings from Last 3 Encounters:  04/19/21 230 lb 3.2 oz (104.4 kg)  03/06/21 232 lb (105.2 kg)  02/06/21  233 lb 8 oz (105.9 kg)    Physical Exam Vitals and nursing note reviewed.  Constitutional:      General: She is not in acute distress.    Appearance: Normal appearance. She is obese. She is not toxic-appearing.  Eyes:     General:        Right eye: No discharge.        Left eye: No discharge.     Extraocular Movements: Extraocular movements intact.  Cardiovascular:     Rate and Rhythm: Normal rate and regular rhythm.     Heart sounds: Normal heart sounds. No murmur heard. Pulmonary:     Effort: Pulmonary effort is normal. No respiratory distress.  Abdominal:     General:  Abdomen is flat. Bowel sounds are normal.     Palpations: Abdomen is soft.  Musculoskeletal:     Right lower leg: No edema.     Left lower leg: No edema.  Skin:    General: Skin is warm and dry.     Coloration: Skin is not jaundiced or pale.     Findings: No erythema.  Neurological:     Mental Status: She is alert and oriented to person, place, and time.     Motor: No weakness.     Gait: Gait normal.  Psychiatric:        Mood and Affect: Mood normal.        Behavior: Behavior normal.        Thought Content: Thought content normal.        Judgment: Judgment normal.      Assessment & Plan:   Problem List Items Addressed This Visit       Endocrine   Type II diabetes mellitus (HCC) - Primary    Chronic.  Not due for A1c today.  She is tolerating addition of semaglutide well-we will plan to increase to 1 mg weekly for 4 weeks, then increase to 2 mg weekly.  Follow-up 3 months to repeat blood work.      Relevant Medications   Semaglutide, 1 MG/DOSE, 4 MG/3ML SOPN     Other   BMI 39.0-39.9,adult    BMI has not dropped below 40.  She is tolerating semaglutide well.  We will plan to continue, increase to 1 mg weekly for 4 weeks, then 2 mg weekly thereafter.  She does have type 2 diabetes.  Again, we discussed dietary and lifestyle changes.  Continue working on physical activity-goal is 30 minutes 5 times per week.  Follow-up 3 months.      Other Visit Diagnoses     Encounter for screening mammogram for malignant neoplasm of breast       Relevant Orders   MM 3D SCREEN BREAST BILATERAL        Follow up plan: Return in about 3 months (around 07/20/2021) for weight loss follow up, dm, hld.

## 2021-04-25 MED ORDER — SEMAGLUTIDE (1 MG/DOSE) 4 MG/3ML ~~LOC~~ SOPN: PEN_INJECTOR | SUBCUTANEOUS | 0 refills | Status: AC

## 2021-04-25 NOTE — Assessment & Plan Note (Signed)
Chronic.  Not due for A1c today.  She is tolerating addition of semaglutide well-we will plan to increase to 1 mg weekly for 4 weeks, then increase to 2 mg weekly.  Follow-up 3 months to repeat blood work.

## 2021-04-25 NOTE — Assessment & Plan Note (Signed)
BMI has not dropped below 40.  She is tolerating semaglutide well.  We will plan to continue, increase to 1 mg weekly for 4 weeks, then 2 mg weekly thereafter.  She does have type 2 diabetes.  Again, we discussed dietary and lifestyle changes.  Continue working on physical activity-goal is 30 minutes 5 times per week.  Follow-up 3 months.

## 2021-06-06 ENCOUNTER — Other Ambulatory Visit (HOSPITAL_COMMUNITY): Payer: Self-pay | Admitting: Nurse Practitioner

## 2021-06-06 ENCOUNTER — Encounter: Payer: Self-pay | Admitting: Nurse Practitioner

## 2021-06-06 DIAGNOSIS — Z1231 Encounter for screening mammogram for malignant neoplasm of breast: Secondary | ICD-10-CM

## 2021-06-07 NOTE — Telephone Encounter (Signed)
She can continue semaglutide 1 mg for 1 more month before increasing to 2 mg dose.  Please encourage her to eat plenty of fiber and drink plenty of water to also help with regular bowel movements.

## 2021-06-08 ENCOUNTER — Other Ambulatory Visit: Payer: Self-pay

## 2021-06-08 DIAGNOSIS — Z1211 Encounter for screening for malignant neoplasm of colon: Secondary | ICD-10-CM

## 2021-06-12 ENCOUNTER — Other Ambulatory Visit: Payer: Self-pay

## 2021-06-12 ENCOUNTER — Ambulatory Visit (HOSPITAL_COMMUNITY)
Admission: RE | Admit: 2021-06-12 | Discharge: 2021-06-12 | Disposition: A | Discharge status: Home / Self Care | Payer: 59 | Source: Ambulatory Visit | Attending: Nurse Practitioner | Admitting: Nurse Practitioner

## 2021-06-12 DIAGNOSIS — Z1231 Encounter for screening mammogram for malignant neoplasm of breast: Secondary | ICD-10-CM | POA: Diagnosis present

## 2021-07-17 ENCOUNTER — Ambulatory Visit: Payer: 59 | Admitting: Nurse Practitioner

## 2022-02-15 DIAGNOSIS — E782 Mixed hyperlipidemia: Secondary | ICD-10-CM | POA: Diagnosis not present

## 2022-02-15 DIAGNOSIS — E1169 Type 2 diabetes mellitus with other specified complication: Secondary | ICD-10-CM | POA: Diagnosis not present

## 2022-02-21 DIAGNOSIS — Z0001 Encounter for general adult medical examination with abnormal findings: Secondary | ICD-10-CM | POA: Diagnosis not present

## 2022-07-26 ENCOUNTER — Encounter: Payer: Self-pay | Admitting: Radiology

## 2023-02-22 ENCOUNTER — Ambulatory Visit
Admission: RE | Admit: 2023-02-22 | Discharge: 2023-02-22 | Disposition: A | Discharge status: Home / Self Care | Payer: BC Managed Care – PPO | Source: Ambulatory Visit

## 2023-02-22 VITALS — BP 169/86 | HR 80 | Temp 98.3°F | Resp 18

## 2023-02-22 DIAGNOSIS — K047 Periapical abscess without sinus: Secondary | ICD-10-CM | POA: Diagnosis not present

## 2023-02-22 MED ORDER — CHLORHEXIDINE GLUCONATE 0.12 % MT SOLN: 15.0000 mL | Freq: Two times a day (BID) | OROMUCOSAL | 0 refills | Status: AC

## 2023-02-22 MED ORDER — CLINDAMYCIN HCL 300 MG PO CAPS: 300.0000 mg | ORAL_CAPSULE | Freq: Two times a day (BID) | ORAL | 0 refills | Status: AC

## 2023-02-22 NOTE — ED Triage Notes (Signed)
Pt reports she has a dental abscess on the left side of her mouth x 1 day     Does have dentist but they were closed today

## 2023-02-22 NOTE — Discharge Instructions (Signed)
Follow-up with dentist first thing next week.  Return sooner if worsening at any time.

## 2023-02-22 NOTE — ED Provider Notes (Signed)
[facility]    CSN: [id number] Arrival date & time: 02/22/23  1448      History   Chief Complaint Chief Complaint  Patient presents with   Abscess    Woke up with gum swelling and tenderness.Dentist closed today - Entered by patient    HPI Hayley Lynch is a 57 y.o. female.   Patient presenting today with 1 day history of right lower dental pain, swelling at the gumline.  Denies drainage, bleeding, difficulty breathing or swallowing, injury to the area, fever, chills.  So far trying over-the-counter pain relievers with mild relief temporarily for pain.  States dentist is closed today.    Past Medical History:  Diagnosis Date   Nondisplaced fracture of lateral malleolus of left fibula, initial encounter for closed fracture 01/14/2020    Patient Active Problem List   Diagnosis Date Noted   BMI 39.0-39.9,adult 02/08/2021   Urge incontinence of urine 02/08/2021   Type II diabetes mellitus (HCC) 02/06/2021   Vitamin D deficiency 02/06/2021   Hyperlipidemia associated with type 2 diabetes mellitus (HCC) 02/06/2021   Hepatic steatosis 02/06/2021    Past Surgical History:  Procedure Laterality Date   CHOLECYSTECTOMY      OB History     Gravida  4   Para      Term      Preterm      AB  1   Living  3      SAB      IAB  1   Ectopic      Multiple      Live Births  3            Home Medications    Prior to Admission medications   Medication Sig Start Date End Date Taking? Authorizing Provider  chlorhexidine (PERIDEX) 0.12 % solution Use as directed 15 mLs in the mouth or throat 2 (two) times daily. 02/22/23  Yes Particia Nearing, PA-C  clindamycin (CLEOCIN) 300 MG capsule Take 1 capsule (300 mg total) by mouth 2 (two) times daily. 02/22/23  Yes Particia Nearing, PA-C  OZEMPIC, 0.25 OR 0.5 MG/DOSE, 2 MG/3ML SOPN Inject into the skin. 02/09/23  Yes [provider]  rosuvastatin (CRESTOR) 5 MG tablet Take 5 mg by mouth  daily. 02/09/23  Yes [provider]  acetaminophen (TYLENOL) 500 MG tablet Take 500 mg by mouth every 6 (six) hours as needed.    [provider]  cholecalciferol (VITAMIN D3) 25 MCG (1000 UNIT) tablet Take 1,000 Units by mouth daily.    [provider]  Ezetimibe (ZETIA PO) Take by mouth. 10 mg  once a day    [provider]  metFORMIN (GLUCOPHAGE-XR) 500 MG 24 hr tablet Take 500 mg by mouth every morning. 11/29/20   [provider]    Family History Family History  Problem Relation Age of Onset   Cancer Mother        ovarian    Social History Social History   Tobacco Use   Smoking status: Former    Current packs/day: 0.00    Types: Cigarettes, E-cigarettes    Quit date: 06/28/2018    Years since quitting: 4.6   Smokeless tobacco: Never  Vaping Use   Vaping status: Every Day  Substance Use Topics   Alcohol use: Not Currently   Drug use: Not Currently     Allergies   Codeine and Penicillins   Review of Systems Review of Systems Per HPI  Physical Exam Triage Vital Signs ED Triage Vitals  Encounter Vitals Group     BP 02/22/23 1524 (!) 169/86     Systolic BP Percentile --      Diastolic BP Percentile --      Pulse Rate 02/22/23 1524 80     Resp 02/22/23 1524 18     Temp 02/22/23 1524 98.3 F (36.8 C)     Temp Source 02/22/23 1524 Oral     SpO2 02/22/23 1524 95 %     Weight --      Height --      Head Circumference --      Peak Flow --      Pain Score 02/22/23 1525 5     Pain Loc --      Pain Education --      Exclude from Growth Chart --    No data found.  Updated Vital Signs BP (!) 169/86 (BP Location: Right Arm)   Pulse 80   Temp 98.3 F (36.8 C) (Oral)   Resp 18   SpO2 95%   Visual Acuity Right Eye Distance:   Left Eye Distance:   Bilateral Distance:    Right Eye Near:   Left Eye Near:    Bilateral Near:     Physical Exam Vitals and nursing note reviewed.  Constitutional:      Appearance:  Normal appearance. She is not ill-appearing.  HENT:     Head: Atraumatic.     Mouth/Throat:     Mouth: Mucous membranes are moist.     Pharynx: Oropharynx is clear.     Comments: Area of gingival erythema, edema to the right lower jaw at the base of molar Eyes:     Extraocular Movements: Extraocular movements intact.     Conjunctiva/sclera: Conjunctivae normal.  Cardiovascular:     Rate and Rhythm: Normal rate and regular rhythm.     Heart sounds: Normal heart sounds.  Pulmonary:     Effort: Pulmonary effort is normal.     Breath sounds: Normal breath sounds.  Musculoskeletal:        General: Normal range of motion.     Cervical back: Normal range of motion and neck supple.  Skin:    General: Skin is warm and dry.  Neurological:     Mental Status: She is alert and oriented to person, place, and time.  Psychiatric:        Mood and Affect: Mood normal.        Thought Content: Thought content normal.        Judgment: Judgment normal.      UC Treatments / Results  Labs (all labs ordered are listed, but only abnormal results are displayed) Labs Reviewed - No data to display  EKG   Radiology No results found.  Procedures Procedures (including critical care time)  Medications Ordered in UC Medications - No data to display  Initial Impression / Assessment and Plan / UC Course  I have reviewed the triage vital signs and the nursing notes.  Pertinent labs & imaging results that were available during my care of the patient were reviewed by me and considered in my medical decision making (see chart for details).     Will treat with clindamycin, Peridex rinse, over-the-counter pain relievers.  Close dental follow-up recommended.  Return for worsening symptoms at any time.  Final Clinical Impressions(s) / UC Diagnoses   Final diagnoses:  Dental infection     Discharge Instructions  Follow-up with dentist first thing next week.  Return sooner if worsening at  any time.    ED Prescriptions     Medication Sig Dispense Auth. Provider   clindamycin (CLEOCIN) 300 MG capsule Take 1 capsule (300 mg total) by mouth 2 (two) times daily. 14 capsule Particia Nearing, [place]   chlorhexidine (PERIDEX) 0.12 % solution Use as directed 15 mLs in the mouth or throat 2 (two) times daily. 120 mL Particia Nearing, [place]      PDMP not reviewed this encounter.   Particia Nearing, [place] 02/22/23 1541

## 2023-04-12 ENCOUNTER — Other Ambulatory Visit (HOSPITAL_COMMUNITY): Payer: Self-pay | Admitting: Nurse Practitioner

## 2023-04-12 DIAGNOSIS — F17201 Nicotine dependence, unspecified, in remission: Secondary | ICD-10-CM

## 2023-08-23 ENCOUNTER — Other Ambulatory Visit (HOSPITAL_COMMUNITY): Payer: Self-pay | Admitting: Internal Medicine

## 2023-08-23 DIAGNOSIS — F17201 Nicotine dependence, unspecified, in remission: Secondary | ICD-10-CM

## 2023-11-23 IMAGING — MG MM DIGITAL SCREENING BILAT W/ TOMO AND CAD
8 series · 8 of 24 positions shown · non-contrast
Comparison: Previous exam(s).

CLINICAL DATA: Screening.

EXAM:
DIGITAL SCREENING BILATERAL MAMMOGRAM WITH TOMOSYNTHESIS AND CAD
TECHNIQUE: Bilateral screening digital craniocaudal and mediolateral oblique
mammograms were obtained. Bilateral screening digital breast
tomosynthesis was performed. The images were evaluated with
computer-aided detection.

[L MLO synth-2D]
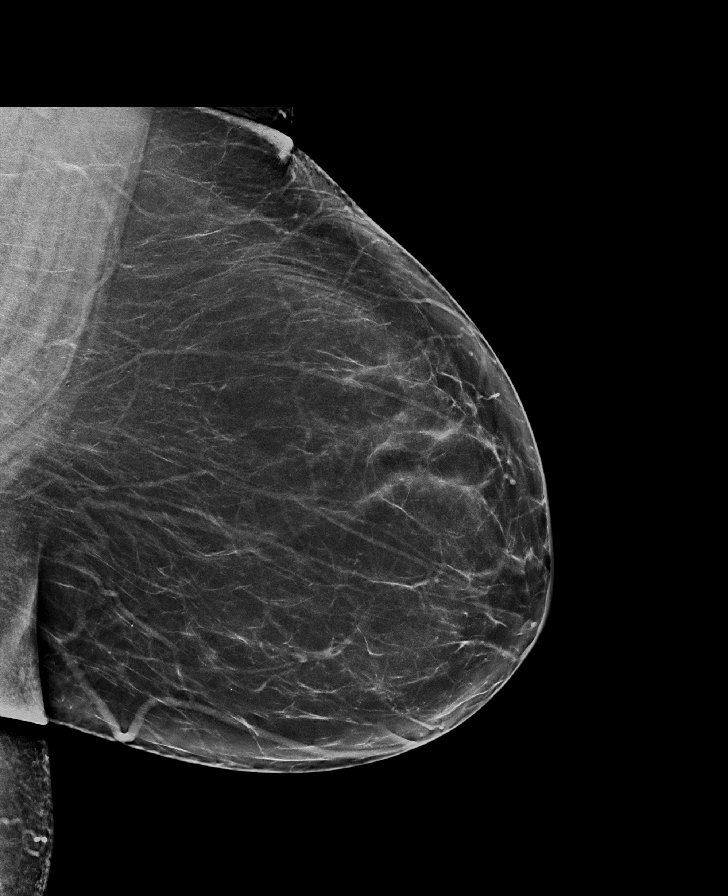

[L CC synth-2D]
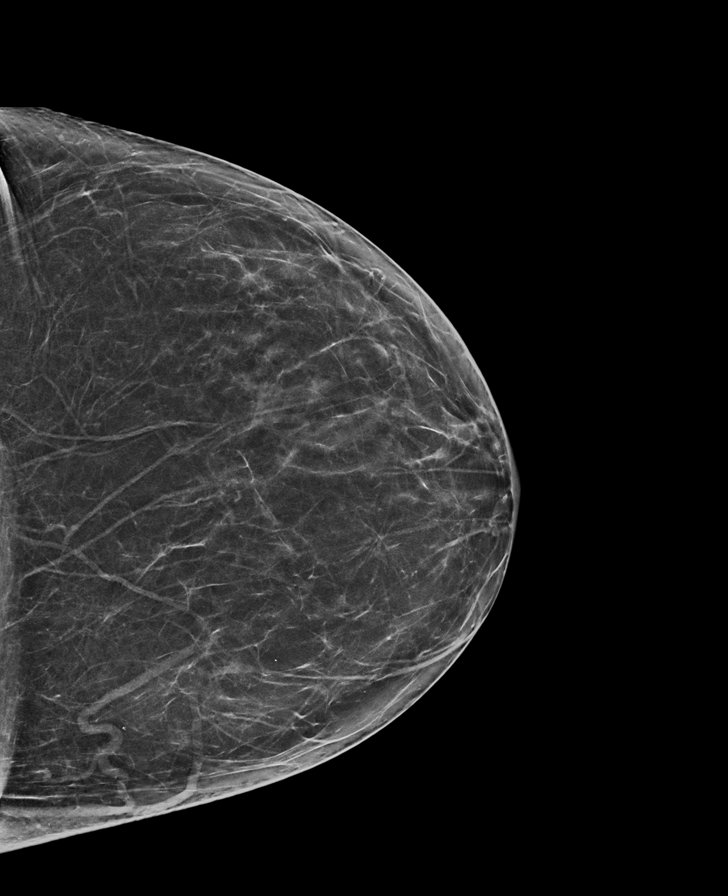

[R CC synth-2D]
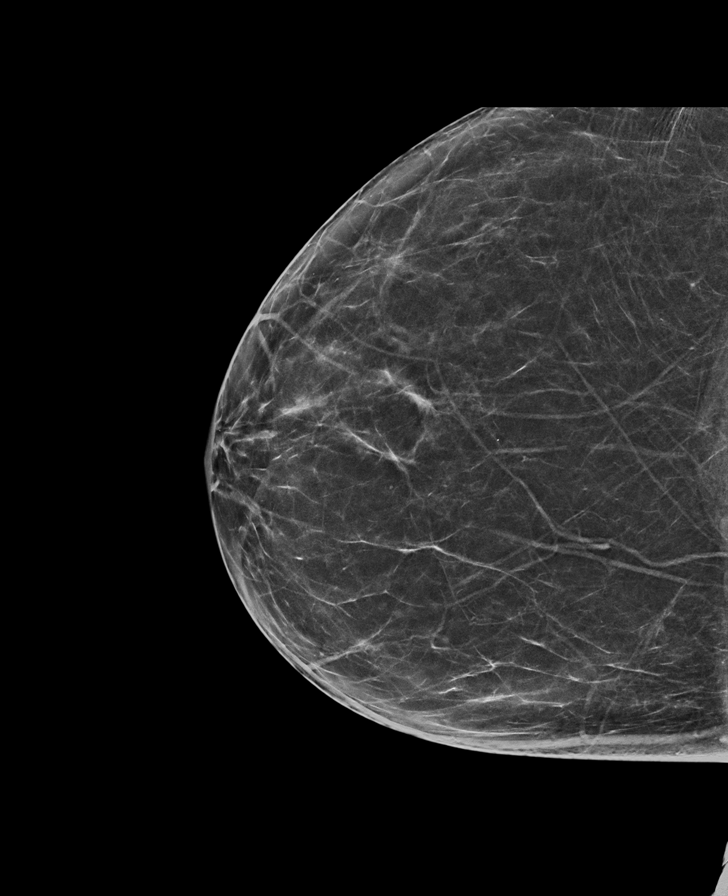

[R MLO synth-2D]
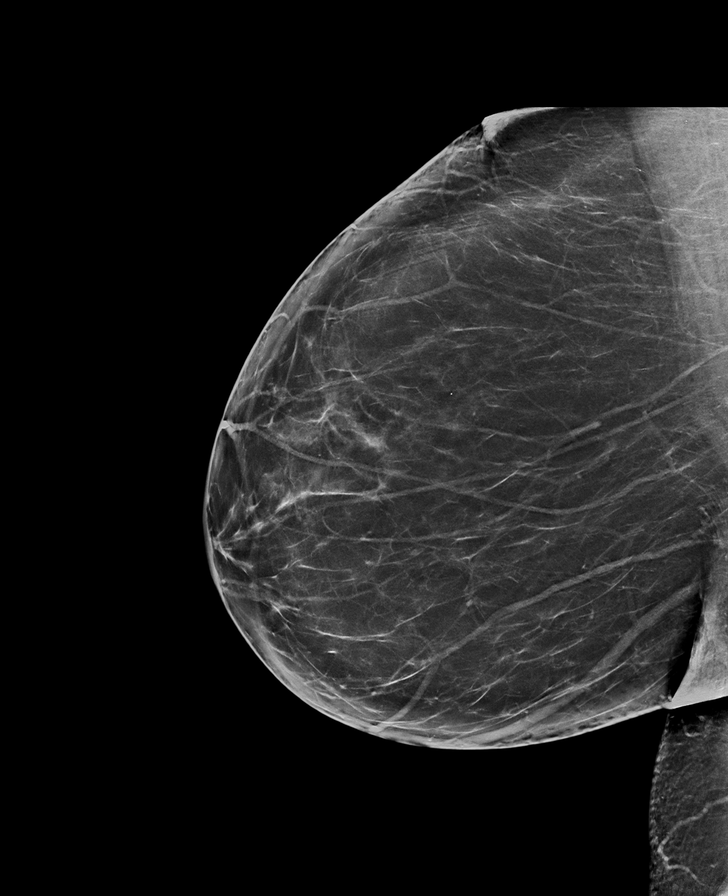

[R MLO tomo · tomo slice 40/79.0]
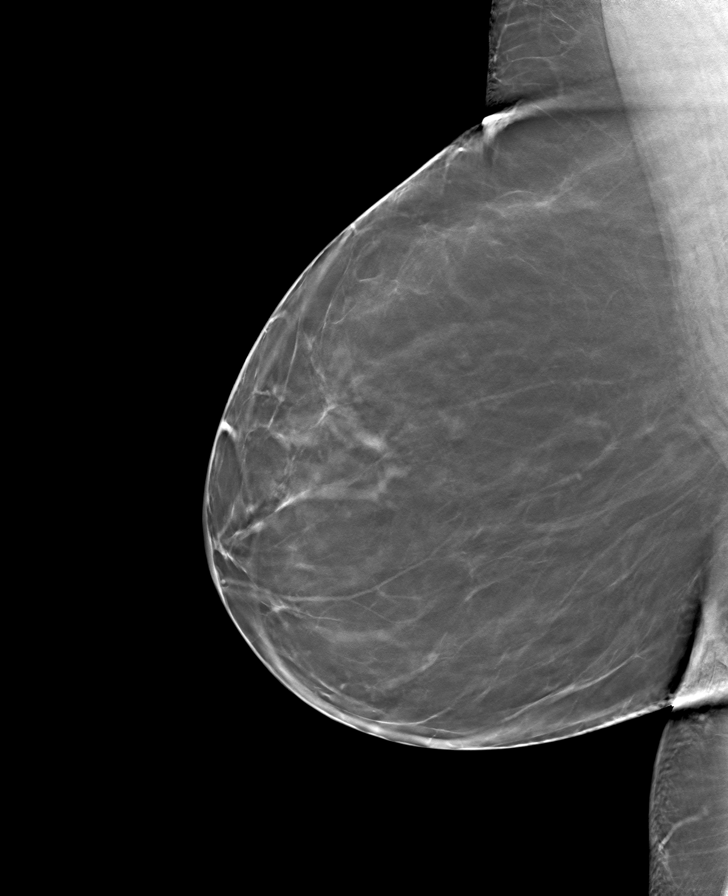

[L MLO tomo · tomo slice 41/82.0]
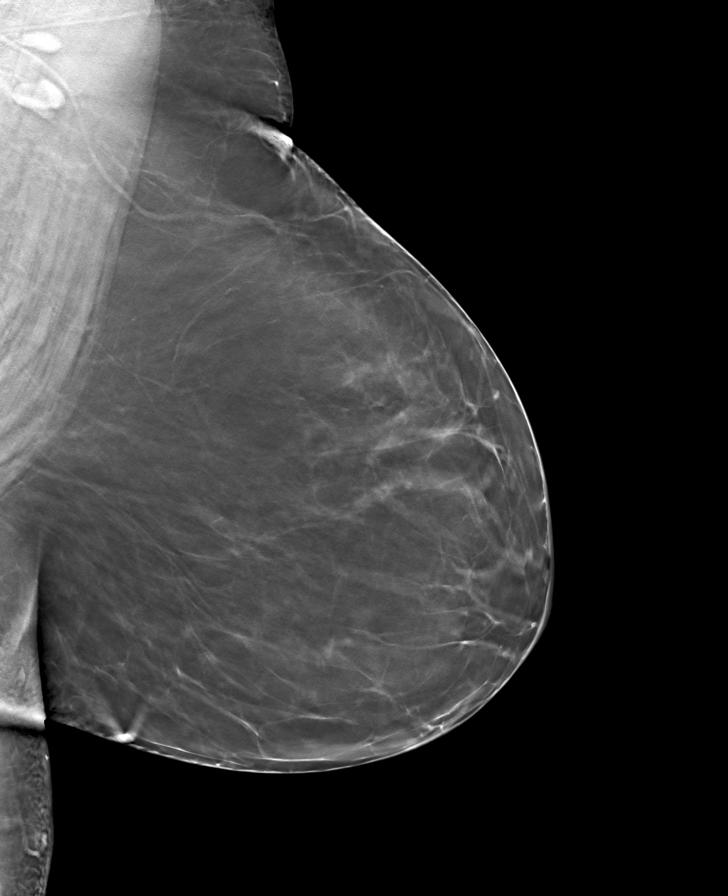

[R CC tomo · tomo slice 34/67.0]
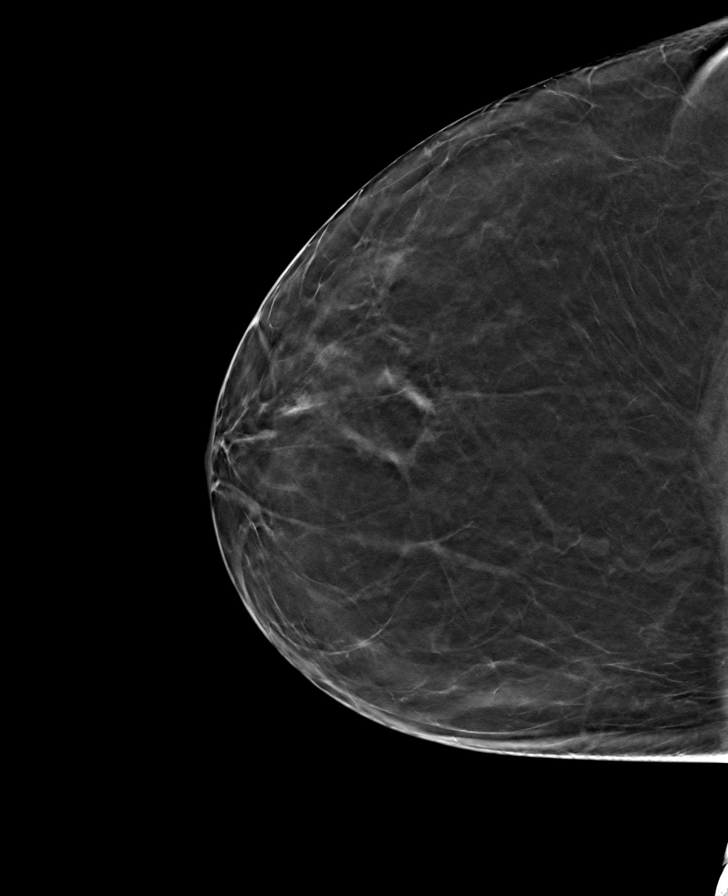

[L CC tomo · tomo slice 35/69.0]
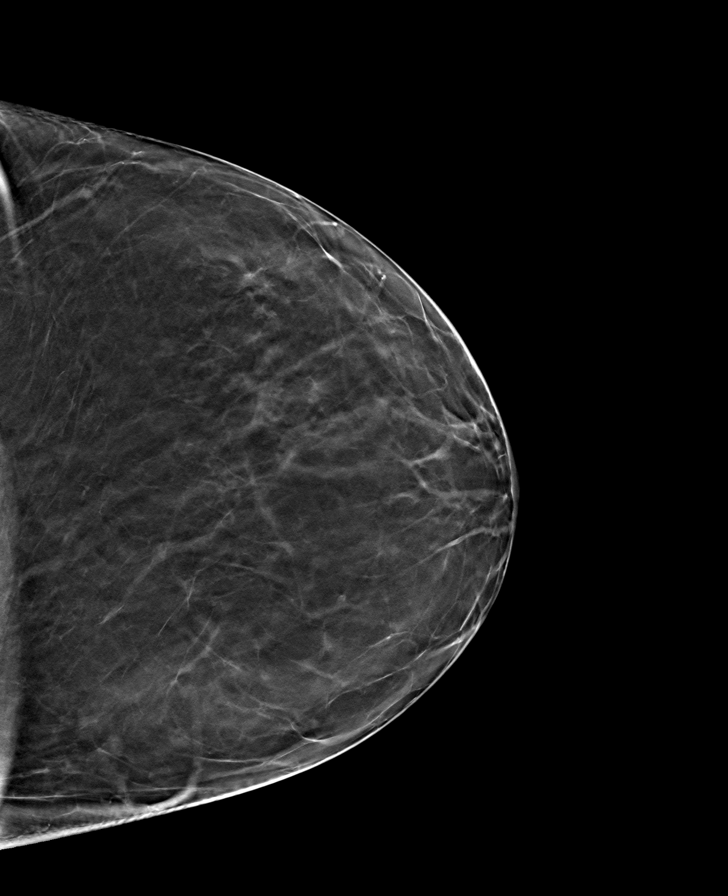

[8 of 24 positions shown; findings below may reference images not displayed]

ACR Breast Density Category b: There are scattered areas of
fibroglandular density.
FINDINGS: There are no findings suspicious for malignancy.
IMPRESSION: No mammographic evidence of malignancy. A result letter of this
screening mammogram will be mailed directly to the patient.

RECOMMENDATION:
Screening mammogram in one year. (Code:51-O-LD2)

BI-RADS CATEGORY  1: Negative.

## 2023-12-04 ENCOUNTER — Other Ambulatory Visit (HOSPITAL_COMMUNITY): Payer: Self-pay | Admitting: Internal Medicine

## 2023-12-04 DIAGNOSIS — Z1231 Encounter for screening mammogram for malignant neoplasm of breast: Secondary | ICD-10-CM

## 2023-12-12 ENCOUNTER — Ambulatory Visit (HOSPITAL_COMMUNITY)
Admission: RE | Admit: 2023-12-12 | Discharge: 2023-12-12 | Disposition: A | Discharge status: Home / Self Care | Source: Ambulatory Visit | Attending: Internal Medicine | Admitting: Internal Medicine

## 2023-12-12 ENCOUNTER — Encounter (HOSPITAL_COMMUNITY): Payer: Self-pay

## 2023-12-12 DIAGNOSIS — Z1231 Encounter for screening mammogram for malignant neoplasm of breast: Secondary | ICD-10-CM | POA: Diagnosis present

## 2023-12-12 LAB — COLOGUARD: COLOGUARD: NEGATIVE

## 2023-12-13 ENCOUNTER — Other Ambulatory Visit (HOSPITAL_COMMUNITY): Payer: Self-pay | Admitting: Internal Medicine

## 2023-12-13 DIAGNOSIS — F17201 Nicotine dependence, unspecified, in remission: Secondary | ICD-10-CM

## 2023-12-29 ENCOUNTER — Ambulatory Visit (HOSPITAL_COMMUNITY)
Admission: RE | Admit: 2023-12-29 | Discharge: 2023-12-29 | Disposition: A | Discharge status: Home / Self Care | Source: Ambulatory Visit | Attending: Internal Medicine | Admitting: Internal Medicine

## 2023-12-29 DIAGNOSIS — F17201 Nicotine dependence, unspecified, in remission: Secondary | ICD-10-CM | POA: Diagnosis present

## 2023-12-29 DIAGNOSIS — Z122 Encounter for screening for malignant neoplasm of respiratory organs: Secondary | ICD-10-CM | POA: Insufficient documentation

## 2023-12-29 DIAGNOSIS — J439 Emphysema, unspecified: Secondary | ICD-10-CM | POA: Diagnosis not present

## 2023-12-29 DIAGNOSIS — D3502 Benign neoplasm of left adrenal gland: Secondary | ICD-10-CM | POA: Insufficient documentation

## 2023-12-29 DIAGNOSIS — F17211 Nicotine dependence, cigarettes, in remission: Secondary | ICD-10-CM | POA: Diagnosis not present
